# Patient Record
Sex: Male | Born: 1955 | Race: White | Hispanic: No | Marital: Married | Smoking: Never smoker
Health system: Southern US, Community
[De-identification: ages and names within clinical notes are randomized; demographics above are authoritative.]

## PROBLEM LIST (undated history)

## (undated) DIAGNOSIS — I10 Essential (primary) hypertension: Secondary | ICD-10-CM

## (undated) DIAGNOSIS — I499 Cardiac arrhythmia, unspecified: Secondary | ICD-10-CM

## (undated) DIAGNOSIS — Z87442 Personal history of urinary calculi: Secondary | ICD-10-CM

## (undated) DIAGNOSIS — R519 Headache, unspecified: Secondary | ICD-10-CM

## (undated) DIAGNOSIS — E119 Type 2 diabetes mellitus without complications: Secondary | ICD-10-CM

## (undated) DIAGNOSIS — K7460 Unspecified cirrhosis of liver: Secondary | ICD-10-CM

## (undated) DIAGNOSIS — Z973 Presence of spectacles and contact lenses: Secondary | ICD-10-CM

## (undated) DIAGNOSIS — E7800 Pure hypercholesterolemia, unspecified: Secondary | ICD-10-CM

## (undated) DIAGNOSIS — G4730 Sleep apnea, unspecified: Secondary | ICD-10-CM

## (undated) DIAGNOSIS — G510 Bell's palsy: Secondary | ICD-10-CM

## (undated) DIAGNOSIS — R51 Headache: Secondary | ICD-10-CM

## (undated) DIAGNOSIS — Z8679 Personal history of other diseases of the circulatory system: Secondary | ICD-10-CM

## (undated) DIAGNOSIS — M1990 Unspecified osteoarthritis, unspecified site: Secondary | ICD-10-CM

## (undated) DIAGNOSIS — Q631 Lobulated, fused and horseshoe kidney: Secondary | ICD-10-CM

## (undated) DIAGNOSIS — K219 Gastro-esophageal reflux disease without esophagitis: Secondary | ICD-10-CM

## (undated) DIAGNOSIS — D696 Thrombocytopenia, unspecified: Secondary | ICD-10-CM

## (undated) HISTORY — PX: ORIF PATELLA FRACTURE: SUR947

## (undated) HISTORY — PX: BICEPS TENDON REPAIR: SHX566

## (undated) HISTORY — PX: KNEE ARTHROSCOPY: SUR90

## (undated) HISTORY — PX: ADENOIDECTOMY: SUR15

## (undated) HISTORY — PX: TONSILLECTOMY: SUR1361

## (undated) HISTORY — PX: COLONOSCOPY W/ POLYPECTOMY: SHX1380

## (undated) HISTORY — PX: SPINAL CORD STIMULATOR INSERTION: SHX5378

---

## 1983-01-16 HISTORY — PX: CIRCUMCISION: SUR203

## 2013-04-29 ENCOUNTER — Other Ambulatory Visit: Payer: Self-pay | Admitting: Neurosurgery

## 2013-05-01 ENCOUNTER — Encounter (HOSPITAL_COMMUNITY): Payer: Self-pay

## 2013-05-06 NOTE — Pre-Procedure Instructions (Signed)
Brandon Pena  05/06/2013   Your procedure is scheduled on:  Fri, May 1 @ 7:30 AM  Report to [facility]  at 5:30 AM.  Call this number if you have problems the morning of surgery: [phone]   Remember:   Do not eat food or drink liquids after midnight.   Take these medicines the morning of surgery with A SIP OF WATER: Atenolol(Tenormin) and Omeprazole(Prilosec)              Stop taking your Aspirin. No Goody's,BC's,Aleve,Ibuprofen,Fish Oil,or any Herbal Medications   Do not wear jewelry  Do not wear lotions, powders, or colognes. You may wear deodorant.  Men may shave face and neck.  Do not bring valuables to the hospital.  [facility] is not responsible                  for any belongings or valuables.               Contacts, dentures or bridgework may not be worn into surgery.  Leave suitcase in the car. After surgery it may be brought to your room.  For patients admitted to the hospital, discharge time is determined by your                treatment team.               Patients discharged the day of surgery will not be allowed to drive  home.    Special Instructions:  [facility] - Preparing for Surgery  Before surgery, you can play an important role.  Because skin is not sterile, your skin needs to be as free of germs as possible.  You can reduce the number of germs on you skin by washing with CHG (chlorahexidine gluconate) soap before surgery.  CHG is an antiseptic cleaner which kills germs and bonds with the skin to continue killing germs even after washing.  Please DO NOT use if you have an allergy to CHG or antibacterial soaps.  If your skin becomes reddened/irritated stop using the CHG and inform your nurse when you arrive at Short Stay.  Do not shave (including legs and underarms) for at least 48 hours prior to the first CHG shower.  You may shave your face.  Please follow these instructions carefully:   1.  Shower with CHG Soap the night before surgery and  the                                morning of Surgery.  2.  If you choose to wash your hair, wash your hair first as usual with your       normal shampoo.  3.  After you shampoo, rinse your hair and body thoroughly to remove the                      Shampoo.  4.  Use CHG as you would any other liquid soap.  You can apply chg directly       to the skin and wash gently with scrungie or a clean washcloth.  5.  Apply the CHG Soap to your body ONLY FROM THE NECK DOWN.        Do not use on open wounds or open sores.  Avoid contact with your eyes,       ears, mouth and genitals (private parts).  Wash genitals (  private parts)       with your normal soap.  6.  Wash thoroughly, paying special attention to the area where your surgery        will be performed.  7.  Thoroughly rinse your body with warm water from the neck down.  8.  DO NOT shower/wash with your normal soap after using and rinsing off       the CHG Soap.  9.  Pat yourself dry with a clean towel.            10.  Wear clean pajamas.            11.  Place clean sheets on your bed the night of your first shower and do not        sleep with pets.  Day of Surgery  Do not apply any lotions/deoderants the morning of surgery.  Please wear clean clothes to the hospital/surgery center.     Please read over the following fact sheets that you were given: Pain Booklet, Coughing and Deep Breathing, MRSA Information and Surgical Site Infection Prevention

## 2013-05-07 ENCOUNTER — Encounter (HOSPITAL_COMMUNITY): Payer: Self-pay

## 2013-05-07 ENCOUNTER — Encounter (HOSPITAL_COMMUNITY)
Admission: RE | Admit: 2013-05-07 | Discharge: 2013-05-07 | Disposition: A | Discharge status: Home / Self Care | Payer: Worker's Compensation | Source: Ambulatory Visit | Attending: Neurosurgery | Admitting: Neurosurgery

## 2013-05-07 ENCOUNTER — Encounter (HOSPITAL_COMMUNITY)
Admission: RE | Admit: 2013-05-07 | Discharge: 2013-05-07 | Disposition: A | Discharge status: Home / Self Care | Payer: PRIVATE HEALTH INSURANCE | Source: Ambulatory Visit | Attending: Anesthesiology | Admitting: Anesthesiology

## 2013-05-07 DIAGNOSIS — Z01818 Encounter for other preprocedural examination: Secondary | ICD-10-CM | POA: Insufficient documentation

## 2013-05-07 DIAGNOSIS — Z01810 Encounter for preprocedural cardiovascular examination: Secondary | ICD-10-CM | POA: Insufficient documentation

## 2013-05-07 DIAGNOSIS — Z01812 Encounter for preprocedural laboratory examination: Secondary | ICD-10-CM | POA: Insufficient documentation

## 2013-05-07 HISTORY — DX: Gastro-esophageal reflux disease without esophagitis: K21.9

## 2013-05-07 HISTORY — DX: Type 2 diabetes mellitus without complications: E11.9

## 2013-05-07 HISTORY — DX: Essential (primary) hypertension: I10

## 2013-05-07 HISTORY — DX: Pure hypercholesterolemia, unspecified: E78.00

## 2013-05-07 HISTORY — DX: Sleep apnea, unspecified: G47.30

## 2013-05-07 HISTORY — DX: Personal history of other diseases of the circulatory system: Z86.79

## 2013-05-07 HISTORY — DX: Personal history of urinary calculi: Z87.442

## 2013-05-07 LAB — BASIC METABOLIC PANEL
BUN: 21 mg/dL (ref 6–23)
CO2: 21 mEq/L (ref 19–32)
CREATININE: 1 mg/dL (ref 0.50–1.35)
Calcium: 9.8 mg/dL (ref 8.4–10.5)
Chloride: 101 mEq/L (ref 96–112)
GFR, EST NON AFRICAN AMERICAN: 80 mL/min — AB (ref >90)
Glucose, Bld: 96 mg/dL (ref 70–99)
POTASSIUM: 4 meq/L (ref 3.7–5.3)
Sodium: 137 mEq/L (ref 137–147)

## 2013-05-07 LAB — CBC
HCT: 44.9 % (ref 39.0–52.0)
HEMOGLOBIN: 15.8 g/dL (ref 13.0–17.0)
MCH: 29.4 pg (ref 26.0–34.0)
MCHC: 35.2 g/dL (ref 30.0–36.0)
MCV: 83.6 fL (ref 78.0–100.0)
Platelets: 112 10*3/uL — ABNORMAL LOW (ref 150–400)
RBC: 5.37 MIL/uL (ref 4.22–5.81)
RDW: 14.1 % (ref 11.5–15.5)
WBC: 8.8 10*3/uL (ref 4.0–10.5)

## 2013-05-07 LAB — SURGICAL PCR SCREEN
MRSA, PCR: NEGATIVE
Staphylococcus aureus: NEGATIVE

## 2013-05-07 NOTE — Progress Notes (Signed)
Erie NoeVanessa notified that orders need to be signed

## 2013-05-07 NOTE — Progress Notes (Signed)
Patient reports 3-4 years ago he woke up with palpitations and shob. Patient reports he went to [facility] and was in a- fib, was evaluated by cardiology and spontaneously converted back to NSR. Patient reports he has not had any symptoms since then. Records requested from [facility] in [place] (any cardiac test, OV, EKG)

## 2013-05-11 NOTE — Progress Notes (Signed)
Brandon Pena is a 58 year old male scheduled for  one level lumbar micro-discectomy by Dr. Wynetta Emerycram on 05/15/2013.  He has a history of obesity, sleep apnea hypertension, type 2 diabetes, and atrial fibrillation. He presented with atrial fibrillation in March 2011. He underwent a stress dobutamine echocardiography which showed no evidence of ischemia normal left ventricular function. He was in cardioverted to sinus rhythm. He is maintaining sinus rhythm since then. His most recent EKG on 05/07/2013 showed normal sinus rhythm. His only anticoagulant is aspirin.  Labs were unremarkable.  He will be evaluated by the anesthesiologist assigned to this case and 05/15/2013.  [name], MD

## 2013-05-14 MED ORDER — DEXAMETHASONE SODIUM PHOSPHATE 10 MG/ML IJ SOLN
10.0000 mg | INTRAMUSCULAR | Status: AC
  Administered 2013-05-15: 10 mg via INTRAVENOUS
  Filled 2013-05-14: qty 1

## 2013-05-14 MED ORDER — DEXTROSE 5 % IV SOLN
3.0000 g | INTRAVENOUS | Status: AC
  Administered 2013-05-15: 3 g via INTRAVENOUS
  Filled 2013-05-14: qty 3000

## 2013-05-15 ENCOUNTER — Encounter (HOSPITAL_COMMUNITY): Payer: Self-pay

## 2013-05-15 ENCOUNTER — Ambulatory Visit (HOSPITAL_COMMUNITY): Payer: Worker's Compensation

## 2013-05-15 ENCOUNTER — Ambulatory Visit (HOSPITAL_COMMUNITY)
Admission: RE | Admit: 2013-05-15 | Discharge: 2013-05-16 | Disposition: A | Discharge status: Home / Self Care | Payer: Worker's Compensation | Source: Ambulatory Visit | Attending: Neurosurgery | Admitting: Neurosurgery

## 2013-05-15 ENCOUNTER — Encounter (HOSPITAL_COMMUNITY)
Admission: RE | Disposition: A | Discharge status: Home / Self Care | Payer: Self-pay | Source: Ambulatory Visit | Attending: Neurosurgery

## 2013-05-15 ENCOUNTER — Encounter (HOSPITAL_COMMUNITY): Payer: Worker's Compensation | Admitting: Anesthesiology

## 2013-05-15 ENCOUNTER — Ambulatory Visit (HOSPITAL_COMMUNITY): Payer: Worker's Compensation | Admitting: Anesthesiology

## 2013-05-15 DIAGNOSIS — M47817 Spondylosis without myelopathy or radiculopathy, lumbosacral region: Secondary | ICD-10-CM | POA: Insufficient documentation

## 2013-05-15 DIAGNOSIS — M5126 Other intervertebral disc displacement, lumbar region: Secondary | ICD-10-CM | POA: Diagnosis present

## 2013-05-15 DIAGNOSIS — Z7982 Long term (current) use of aspirin: Secondary | ICD-10-CM | POA: Insufficient documentation

## 2013-05-15 DIAGNOSIS — E119 Type 2 diabetes mellitus without complications: Secondary | ICD-10-CM | POA: Insufficient documentation

## 2013-05-15 DIAGNOSIS — E7800 Pure hypercholesterolemia, unspecified: Secondary | ICD-10-CM | POA: Insufficient documentation

## 2013-05-15 DIAGNOSIS — K219 Gastro-esophageal reflux disease without esophagitis: Secondary | ICD-10-CM | POA: Insufficient documentation

## 2013-05-15 DIAGNOSIS — I10 Essential (primary) hypertension: Secondary | ICD-10-CM | POA: Insufficient documentation

## 2013-05-15 DIAGNOSIS — I4891 Unspecified atrial fibrillation: Secondary | ICD-10-CM | POA: Insufficient documentation

## 2013-05-15 DIAGNOSIS — G4730 Sleep apnea, unspecified: Secondary | ICD-10-CM | POA: Insufficient documentation

## 2013-05-15 HISTORY — PX: LUMBAR LAMINECTOMY/DECOMPRESSION MICRODISCECTOMY: SHX5026

## 2013-05-15 LAB — GLUCOSE, CAPILLARY
GLUCOSE-CAPILLARY: 89 mg/dL (ref 70–99)
GLUCOSE-CAPILLARY: 131 mg/dL — AB (ref 70–99)
Glucose-Capillary: 137 mg/dL — ABNORMAL HIGH (ref 70–99)
Glucose-Capillary: 140 mg/dL — ABNORMAL HIGH (ref 70–99)

## 2013-05-15 SURGERY — LUMBAR LAMINECTOMY/DECOMPRESSION MICRODISCECTOMY 1 LEVEL
Anesthesia: General | Site: Back | Laterality: Left

## 2013-05-15 MED ORDER — ACETAMINOPHEN 650 MG RE SUPP: 650.0000 mg | RECTAL | Status: DC | PRN

## 2013-05-15 MED ORDER — FENTANYL CITRATE 0.05 MG/ML IJ SOLN
INTRAMUSCULAR | Status: AC
  Filled 2013-05-15: qty 5

## 2013-05-15 MED ORDER — ROCURONIUM BROMIDE 100 MG/10ML IV SOLN
INTRAVENOUS | Status: DC | PRN
  Administered 2013-05-15: 50 mg via INTRAVENOUS

## 2013-05-15 MED ORDER — PROPOFOL 10 MG/ML IV BOLUS
INTRAVENOUS | Status: AC
  Filled 2013-05-15: qty 20

## 2013-05-15 MED ORDER — EPHEDRINE SULFATE 50 MG/ML IJ SOLN
INTRAMUSCULAR | Status: DC | PRN
  Administered 2013-05-15: 15 mg via INTRAVENOUS

## 2013-05-15 MED ORDER — LOSARTAN POTASSIUM 50 MG PO TABS
50.0000 mg | ORAL_TABLET | Freq: Every day | ORAL | Status: DC
  Administered 2013-05-16: 50 mg via ORAL
  Filled 2013-05-15: qty 1

## 2013-05-15 MED ORDER — HEMOSTATIC AGENTS (NO CHARGE) OPTIME
TOPICAL | Status: DC | PRN
  Administered 2013-05-15: 1 via TOPICAL

## 2013-05-15 MED ORDER — HYDROMORPHONE HCL PF 1 MG/ML IJ SOLN
0.5000 mg | INTRAMUSCULAR | Status: DC | PRN
  Administered 2013-05-15: 1 mg via INTRAVENOUS
  Filled 2013-05-15: qty 1

## 2013-05-15 MED ORDER — VECURONIUM BROMIDE 10 MG IV SOLR
INTRAVENOUS | Status: DC | PRN
  Administered 2013-05-15: 3 mg via INTRAVENOUS
  Administered 2013-05-15: 1 mg via INTRAVENOUS

## 2013-05-15 MED ORDER — ALUM & MAG HYDROXIDE-SIMETH 200-200-20 MG/5ML PO SUSP: 30.0000 mL | Freq: Four times a day (QID) | ORAL | Status: DC | PRN

## 2013-05-15 MED ORDER — MENTHOL 3 MG MT LOZG: 1.0000 | LOZENGE | OROMUCOSAL | Status: DC | PRN

## 2013-05-15 MED ORDER — MIDAZOLAM HCL 2 MG/2ML IJ SOLN
INTRAMUSCULAR | Status: AC
  Filled 2013-05-15: qty 2

## 2013-05-15 MED ORDER — HYDROMORPHONE HCL PF 1 MG/ML IJ SOLN: 0.2500 mg | INTRAMUSCULAR | Status: DC | PRN

## 2013-05-15 MED ORDER — GLYCOPYRROLATE 0.2 MG/ML IJ SOLN
INTRAMUSCULAR | Status: DC | PRN
  Administered 2013-05-15: .8 mg via INTRAVENOUS

## 2013-05-15 MED ORDER — SODIUM CHLORIDE 0.9 % IV SOLN
INTRAVENOUS | Status: DC | PRN
  Administered 2013-05-15 (×2): via INTRAVENOUS

## 2013-05-15 MED ORDER — EPHEDRINE SULFATE 50 MG/ML IJ SOLN
INTRAMUSCULAR | Status: AC
  Filled 2013-05-15: qty 1

## 2013-05-15 MED ORDER — ONDANSETRON HCL 4 MG/2ML IJ SOLN
INTRAMUSCULAR | Status: AC
  Filled 2013-05-15: qty 2

## 2013-05-15 MED ORDER — THROMBIN 5000 UNITS EX SOLR
CUTANEOUS | Status: DC | PRN
  Administered 2013-05-15 (×2): 5000 [IU] via TOPICAL

## 2013-05-15 MED ORDER — ONDANSETRON HCL 4 MG/2ML IJ SOLN
INTRAMUSCULAR | Status: DC | PRN
  Administered 2013-05-15: 4 mg via INTRAVENOUS

## 2013-05-15 MED ORDER — PROPOFOL 10 MG/ML IV BOLUS
INTRAVENOUS | Status: DC | PRN
  Administered 2013-05-15: 300 mg via INTRAVENOUS

## 2013-05-15 MED ORDER — METOCLOPRAMIDE HCL 5 MG/ML IJ SOLN: 10.0000 mg | Freq: Once | INTRAMUSCULAR | Status: DC | PRN

## 2013-05-15 MED ORDER — LIDOCAINE HCL (CARDIAC) 20 MG/ML IV SOLN
INTRAVENOUS | Status: AC
  Filled 2013-05-15: qty 5

## 2013-05-15 MED ORDER — LIDOCAINE-EPINEPHRINE 1 %-1:100000 IJ SOLN
INTRAMUSCULAR | Status: DC | PRN
  Administered 2013-05-15: 10 mL

## 2013-05-15 MED ORDER — ATENOLOL 50 MG PO TABS
50.0000 mg | ORAL_TABLET | Freq: Every day | ORAL | Status: DC
  Administered 2013-05-16: 50 mg via ORAL
  Filled 2013-05-15: qty 1

## 2013-05-15 MED ORDER — ACETAMINOPHEN 325 MG PO TABS: 650.0000 mg | ORAL_TABLET | ORAL | Status: DC | PRN

## 2013-05-15 MED ORDER — CEFAZOLIN SODIUM 1-5 GM-% IV SOLN
1.0000 g | Freq: Three times a day (TID) | INTRAVENOUS | Status: AC
  Administered 2013-05-15 (×2): 1 g via INTRAVENOUS
  Filled 2013-05-15 (×3): qty 50

## 2013-05-15 MED ORDER — SIMVASTATIN 5 MG PO TABS
5.0000 mg | ORAL_TABLET | Freq: Every day | ORAL | Status: DC
  Administered 2013-05-15: 5 mg via ORAL
  Filled 2013-05-15 (×2): qty 1

## 2013-05-15 MED ORDER — LACTATED RINGERS IV SOLN
INTRAVENOUS | Status: DC | PRN
  Administered 2013-05-15: 07:00:00 via INTRAVENOUS

## 2013-05-15 MED ORDER — BUPIVACAINE HCL (PF) 0.25 % IJ SOLN
INTRAMUSCULAR | Status: DC | PRN
  Administered 2013-05-15: 10 mL

## 2013-05-15 MED ORDER — ROCURONIUM BROMIDE 50 MG/5ML IV SOLN
INTRAVENOUS | Status: AC
  Filled 2013-05-15: qty 1

## 2013-05-15 MED ORDER — DOCUSATE SODIUM 100 MG PO CAPS
100.0000 mg | ORAL_CAPSULE | Freq: Two times a day (BID) | ORAL | Status: DC
  Administered 2013-05-15 – 2013-05-16 (×2): 100 mg via ORAL
  Filled 2013-05-15 (×3): qty 1

## 2013-05-15 MED ORDER — VECURONIUM BROMIDE 10 MG IV SOLR
INTRAVENOUS | Status: AC
  Filled 2013-05-15: qty 10

## 2013-05-15 MED ORDER — SODIUM CHLORIDE 0.9 % IR SOLN
Status: DC | PRN
  Administered 2013-05-15: 08:00:00

## 2013-05-15 MED ORDER — LIDOCAINE HCL (CARDIAC) 20 MG/ML IV SOLN
INTRAVENOUS | Status: DC | PRN
  Administered 2013-05-15: 100 mg via INTRAVENOUS

## 2013-05-15 MED ORDER — FENTANYL CITRATE 0.05 MG/ML IJ SOLN
INTRAMUSCULAR | Status: DC | PRN
  Administered 2013-05-15: 100 ug via INTRAVENOUS
  Administered 2013-05-15: 150 ug via INTRAVENOUS
  Administered 2013-05-15 (×3): 50 ug via INTRAVENOUS
  Administered 2013-05-15: 100 ug via INTRAVENOUS

## 2013-05-15 MED ORDER — ASPIRIN 325 MG PO TABS
162.5000 mg | ORAL_TABLET | Freq: Every day | ORAL | Status: DC
  Filled 2013-05-15: qty 0.5

## 2013-05-15 MED ORDER — ONDANSETRON HCL 4 MG/2ML IJ SOLN: 4.0000 mg | INTRAMUSCULAR | Status: DC | PRN

## 2013-05-15 MED ORDER — PHENOL 1.4 % MT LIQD: 1.0000 | OROMUCOSAL | Status: DC | PRN

## 2013-05-15 MED ORDER — NEOSTIGMINE METHYLSULFATE 10 MG/10ML IV SOLN
INTRAVENOUS | Status: DC | PRN
  Administered 2013-05-15: 4 mg via INTRAVENOUS

## 2013-05-15 MED ORDER — OXYCODONE HCL 5 MG/5ML PO SOLN: 5.0000 mg | Freq: Once | ORAL | Status: DC | PRN

## 2013-05-15 MED ORDER — SODIUM CHLORIDE 0.9 % IJ SOLN: 3.0000 mL | INTRAMUSCULAR | Status: DC | PRN

## 2013-05-15 MED ORDER — METOCLOPRAMIDE HCL 5 MG/ML IJ SOLN
INTRAMUSCULAR | Status: AC
  Filled 2013-05-15: qty 2

## 2013-05-15 MED ORDER — MELOXICAM 15 MG PO TABS
15.0000 mg | ORAL_TABLET | Freq: Every day | ORAL | Status: DC
  Administered 2013-05-16: 15 mg via ORAL
  Filled 2013-05-15 (×2): qty 1

## 2013-05-15 MED ORDER — TRIAMTERENE-HCTZ 37.5-25 MG PO TABS
1.0000 | ORAL_TABLET | Freq: Every day | ORAL | Status: DC
Start: 2013-05-16 — End: 2013-05-16
  Administered 2013-05-16: 1 via ORAL
  Filled 2013-05-15: qty 1

## 2013-05-15 MED ORDER — OXYCODONE-ACETAMINOPHEN 5-325 MG PO TABS
1.0000 | ORAL_TABLET | ORAL | Status: DC | PRN
  Administered 2013-05-15 – 2013-05-16 (×2): 2 via ORAL
  Filled 2013-05-15 (×2): qty 2

## 2013-05-15 MED ORDER — MIDAZOLAM HCL 5 MG/5ML IJ SOLN
INTRAMUSCULAR | Status: DC | PRN
Start: 2013-05-15 — End: 2013-05-15
  Administered 2013-05-15: 2 mg via INTRAVENOUS

## 2013-05-15 MED ORDER — METOCLOPRAMIDE HCL 5 MG/ML IJ SOLN
INTRAMUSCULAR | Status: DC | PRN
  Administered 2013-05-15: 10 mg via INTRAVENOUS

## 2013-05-15 MED ORDER — METFORMIN HCL 500 MG PO TABS
500.0000 mg | ORAL_TABLET | Freq: Two times a day (BID) | ORAL | Status: DC
  Administered 2013-05-15 – 2013-05-16 (×2): 500 mg via ORAL
  Filled 2013-05-15 (×4): qty 1

## 2013-05-15 MED ORDER — CYCLOBENZAPRINE HCL 10 MG PO TABS
10.0000 mg | ORAL_TABLET | Freq: Three times a day (TID) | ORAL | Status: DC | PRN
  Administered 2013-05-15: 10 mg via ORAL
  Filled 2013-05-15 (×2): qty 1

## 2013-05-15 MED ORDER — SODIUM CHLORIDE 0.9 % IJ SOLN
3.0000 mL | Freq: Two times a day (BID) | INTRAMUSCULAR | Status: DC
  Administered 2013-05-15 (×2): 3 mL via INTRAVENOUS

## 2013-05-15 MED ORDER — PANTOPRAZOLE SODIUM 40 MG PO TBEC
40.0000 mg | DELAYED_RELEASE_TABLET | Freq: Every day | ORAL | Status: DC
  Administered 2013-05-16: 40 mg via ORAL
  Filled 2013-05-15: qty 1

## 2013-05-15 MED ORDER — 0.9 % SODIUM CHLORIDE (POUR BTL) OPTIME
TOPICAL | Status: DC | PRN
  Administered 2013-05-15: 1000 mL

## 2013-05-15 MED ORDER — OXYCODONE HCL 5 MG PO TABS: 5.0000 mg | ORAL_TABLET | Freq: Once | ORAL | Status: DC | PRN

## 2013-05-15 SURGICAL SUPPLY — 62 items
BAG DECANTER FOR FLEXI CONT (MISCELLANEOUS) ×3 IMPLANT
BENZOIN TINCTURE PRP APPL 2/3 (GAUZE/BANDAGES/DRESSINGS) ×3 IMPLANT
BLADE 10 SAFETY STRL DISP (BLADE) IMPLANT
BLADE SURG 11 STRL SS (BLADE) ×3 IMPLANT
BLADE SURG ROTATE 9660 (MISCELLANEOUS) IMPLANT
BRUSH SCRUB EZ PLAIN DRY (MISCELLANEOUS) ×3 IMPLANT
BUR MATCHSTICK NEURO 3.0 LAGG (BURR) ×3 IMPLANT
BUR PRECISION FLUTE 6.0 (BURR) ×3 IMPLANT
CANISTER SUCT 3000ML (MISCELLANEOUS) ×3 IMPLANT
CLOSURE WOUND 1/2 X4 (GAUZE/BANDAGES/DRESSINGS) ×1
CONT SPEC 4OZ CLIKSEAL STRL BL (MISCELLANEOUS) ×3 IMPLANT
DECANTER SPIKE VIAL GLASS SM (MISCELLANEOUS) ×3 IMPLANT
DERMABOND ADHESIVE PROPEN (GAUZE/BANDAGES/DRESSINGS) ×2
DERMABOND ADVANCED (GAUZE/BANDAGES/DRESSINGS) ×2
DERMABOND ADVANCED .7 DNX12 (GAUZE/BANDAGES/DRESSINGS) ×1 IMPLANT
DERMABOND ADVANCED .7 DNX6 (GAUZE/BANDAGES/DRESSINGS) ×1 IMPLANT
DRAPE LAPAROTOMY 100X72X124 (DRAPES) ×3 IMPLANT
DRAPE MICROSCOPE ZEISS OPMI (DRAPES) ×3 IMPLANT
DRAPE POUCH INSTRU U-SHP 10X18 (DRAPES) ×3 IMPLANT
DRAPE PROXIMA HALF (DRAPES) IMPLANT
DRAPE SURG 17X23 STRL (DRAPES) ×3 IMPLANT
DRSG OPSITE 4X5.5 SM (GAUZE/BANDAGES/DRESSINGS) ×3 IMPLANT
DRSG OPSITE POSTOP 3X4 (GAUZE/BANDAGES/DRESSINGS) ×3 IMPLANT
DURAPREP 26ML APPLICATOR (WOUND CARE) ×3 IMPLANT
ELECT BLADE 4.0 EZ CLEAN MEGAD (MISCELLANEOUS) ×3
ELECT REM PT RETURN 9FT ADLT (ELECTROSURGICAL) ×3
ELECTRODE BLDE 4.0 EZ CLN MEGD (MISCELLANEOUS) ×1 IMPLANT
ELECTRODE REM PT RTRN 9FT ADLT (ELECTROSURGICAL) ×1 IMPLANT
GAUZE SPONGE 4X4 16PLY XRAY LF (GAUZE/BANDAGES/DRESSINGS) ×3 IMPLANT
GLOVE BIO SURGEON STRL SZ8 (GLOVE) ×3 IMPLANT
GLOVE ECLIPSE 8.0 STRL XLNG CF (GLOVE) ×3 IMPLANT
GLOVE EXAM NITRILE LRG STRL (GLOVE) IMPLANT
GLOVE EXAM NITRILE MD LF STRL (GLOVE) IMPLANT
GLOVE EXAM NITRILE XL STR (GLOVE) IMPLANT
GLOVE EXAM NITRILE XS STR PU (GLOVE) IMPLANT
GLOVE INDICATOR 8.5 STRL (GLOVE) ×3 IMPLANT
GOWN BRE IMP SLV AUR LG STRL (GOWN DISPOSABLE) IMPLANT
GOWN BRE IMP SLV AUR XL STRL (GOWN DISPOSABLE) IMPLANT
GOWN STRL REIN 2XL LVL4 (GOWN DISPOSABLE) IMPLANT
GOWN STRL REUS W/ TWL LRG LVL3 (GOWN DISPOSABLE) ×2 IMPLANT
GOWN STRL REUS W/ TWL XL LVL3 (GOWN DISPOSABLE) ×2 IMPLANT
GOWN STRL REUS W/TWL LRG LVL3 (GOWN DISPOSABLE) ×4
GOWN STRL REUS W/TWL XL LVL3 (GOWN DISPOSABLE) ×4
KIT BASIN OR (CUSTOM PROCEDURE TRAY) ×3 IMPLANT
KIT ROOM TURNOVER OR (KITS) ×3 IMPLANT
NEEDLE HYPO 22GX1.5 SAFETY (NEEDLE) ×3 IMPLANT
NEEDLE SPNL 22GX3.5 QUINCKE BK (NEEDLE) ×3 IMPLANT
NS IRRIG 1000ML POUR BTL (IV SOLUTION) ×3 IMPLANT
PACK LAMINECTOMY NEURO (CUSTOM PROCEDURE TRAY) ×3 IMPLANT
RUBBERBAND STERILE (MISCELLANEOUS) ×6 IMPLANT
SPONGE GAUZE 4X4 12PLY (GAUZE/BANDAGES/DRESSINGS) ×3 IMPLANT
SPONGE SURGIFOAM ABS GEL SZ50 (HEMOSTASIS) ×3 IMPLANT
STRIP CLOSURE SKIN 1/2X4 (GAUZE/BANDAGES/DRESSINGS) ×2 IMPLANT
SUT VIC AB 0 CT1 18XCR BRD8 (SUTURE) ×1 IMPLANT
SUT VIC AB 0 CT1 8-18 (SUTURE) ×2
SUT VIC AB 2-0 CT1 18 (SUTURE) ×3 IMPLANT
SUT VICRYL 4-0 PS2 18IN ABS (SUTURE) ×3 IMPLANT
SYR 20ML ECCENTRIC (SYRINGE) ×3 IMPLANT
TAPE STRIPS DRAPE STRL (GAUZE/BANDAGES/DRESSINGS) ×3 IMPLANT
TOWEL OR 17X24 6PK STRL BLUE (TOWEL DISPOSABLE) ×3 IMPLANT
TOWEL OR 17X26 10 PK STRL BLUE (TOWEL DISPOSABLE) ×3 IMPLANT
WATER STERILE IRR 1000ML POUR (IV SOLUTION) ×3 IMPLANT

## 2013-05-15 NOTE — Anesthesia Preprocedure Evaluation (Addendum)
Anesthesia Evaluation  Patient identified by MRN, date of birth, ID band Patient awake    Reviewed: Allergy & Precautions, H&P , NPO status , Patient's Chart, lab work & pertinent test results, reviewed documented beta blocker date and time   Airway Mallampati: II TM Distance: >3 FB Neck ROM: full    Dental  (+) Dental Advisory Given   Pulmonary neg pulmonary ROS, sleep apnea ,  breath sounds clear to auscultation        Cardiovascular hypertension, On Medications, On Home Beta Blockers and Pt. on home beta blockers Rhythm:regular     Neuro/Psych negative neurological ROS  negative psych ROS   GI/Hepatic Neg liver ROS, GERD-  Medicated and Controlled,  Endo/Other  diabetes, Oral Hypoglycemic AgentsMorbid obesity  Renal/GU negative Renal ROS  negative genitourinary   Musculoskeletal   Abdominal   Peds  Hematology negative hematology ROS (+)   Anesthesia Other Findings See surgeon's H&P   Reproductive/Obstetrics negative OB ROS                          Anesthesia Physical Anesthesia Plan  ASA: III  Anesthesia Plan: General   Post-op Pain Management:    Induction: Intravenous  Airway Management Planned: Oral ETT  Additional Equipment:   Intra-op Plan:   Post-operative Plan: Extubation in OR  Informed Consent: I have reviewed the patients History and Physical, chart, labs and discussed the procedure including the risks, benefits and alternatives for the proposed anesthesia with the patient or authorized representative who has indicated his/her understanding and acceptance.   Dental Advisory Given  Plan Discussed with: CRNA and Surgeon  Anesthesia Plan Comments:         Anesthesia Quick Evaluation

## 2013-05-15 NOTE — Anesthesia Procedure Notes (Signed)
Procedure Name: Intubation Date/Time: 05/15/2013 7:32 AM Performed by: Alanda AmassFRIEDMAN, Daimion Adamcik A Pre-anesthesia Checklist: Patient identified, Timeout performed, Emergency Drugs available, Suction available and Patient being monitored Patient Re-evaluated:Patient Re-evaluated prior to inductionOxygen Delivery Method: Circle system utilized Preoxygenation: Pre-oxygenation with 100% oxygen Intubation Type: IV induction Ventilation: Mask ventilation without difficulty and Oral airway inserted - appropriate to patient size Laryngoscope Size: Mac and 3 Grade View: Grade III Tube type: Oral Tube size: 7.5 mm Number of attempts: 1 Airway Equipment and Method: Stylet Placement Confirmation: ETT inserted through vocal cords under direct vision,  breath sounds checked- equal and bilateral and positive ETCO2 Secured at: 24 cm Tube secured with: Tape Dental Injury: Teeth and Oropharynx as per pre-operative assessment

## 2013-05-15 NOTE — H&P (Signed)
Brandon Pena is an 58 y.o. male.   Chief Complaint: Back and left leg pain HPI: Patient is a very pleasant 58 year old 7 who sustained a work-related injury resulting in disc herniation and severe intractable left L5 radiculopathy. Workup revealed severe stenosis and compression of left L5 nerve root combination of spondylosis and disc herniation patient failed all forms of conservative treatment with anti-inflammatories physical therapy epidural steroid injections. And due to his failure conservative treatment, progression of clinical syndrome, and imaging findings I recommended an L4-5 laminectomy inspection this possible discectomy at L4-5 on the left. I extensively went over the risks and benefits of the operation the patient as well as perioperative course and expectations of outcome and alternatives of surgery he understands and reached proceed forward.  Past Medical History  Diagnosis Date  . Hypercholesteremia   . Type II diabetes mellitus   . Hypertension     Does not see a cardiologist  . History of atrial fibrillation     reports one time issue in 2012  . History of kidney stones   . GERD (gastroesophageal reflux disease)   . Sleep apnea     uses CPAP, last sleep study done 8-4346yrs. ago    Past Surgical History  Procedure Laterality Date  . Knee arthroscopy Left 1980's  . Orif patella fracture Left 1980's  . Circumcision  1985  . Biceps tendon repair  1990's    No family history on file. Social History:  reports that he has never smoked. He has never used smokeless tobacco. He reports that he does not drink alcohol or use illicit drugs.  Allergies: Not on File  Medications Prior to Admission  Medication Sig Dispense Refill  . aspirin 325 MG tablet Take 162.5 mg by mouth daily.      Marland Kitchen. atenolol (TENORMIN) 50 MG tablet Take 50 mg by mouth daily.      . Cholecalciferol (VITAMIN D PO) Take 1 tablet by mouth daily.      Marland Kitchen. losartan (COZAAR) 50 MG tablet Take 50 mg by mouth  daily.      . meloxicam (MOBIC) 15 MG tablet Take 15 mg by mouth daily.      . metFORMIN (GLUCOPHAGE) 500 MG tablet Take 500 mg by mouth 2 (two) times daily with a meal.      . omeprazole (PRILOSEC) 20 MG capsule Take 20 mg by mouth 2 (two) times daily before a meal.      . Potassium (POTASSIMIN PO) Take 1 tablet by mouth daily. OTC store brand      . pravastatin (PRAVACHOL) 40 MG tablet Take 40 mg by mouth daily.      Marland Kitchen. triamterene-hydrochlorothiazide (MAXZIDE-25) 37.5-25 MG per tablet Take 1 tablet by mouth daily.        Results for orders placed during the hospital encounter of 05/15/13 (from the past 48 hour(s))  GLUCOSE, CAPILLARY     Status: None   Collection Time    05/15/13  6:18 AM      Result Value Ref Range   Glucose-Capillary 89  70 - 99 mg/dL   No results found.  Review of Systems  Constitutional: Negative.   HENT: Negative.   Eyes: Negative.   Respiratory: Negative.   Cardiovascular: Negative.   Gastrointestinal: Negative.   Genitourinary: Negative.   Musculoskeletal: Positive for back pain and myalgias.  Skin: Negative.   Neurological: Positive for tingling and sensory change.  Endo/Heme/Allergies: Negative.   Psychiatric/Behavioral: Negative.     Blood  pressure 110/65, pulse 72, temperature 98 F (36.7 C), temperature source Oral, resp. rate 18, SpO2 98.00%. Physical Exam  Constitutional: He is oriented to person, place, and time. He appears well-developed and well-nourished.  HENT:  Head: Normocephalic.  Eyes: Pupils are equal, round, and reactive to light.  Neck: Normal range of motion.  Cardiovascular: Normal rate.   Respiratory: Effort normal.  GI: Soft.  Neurological: He is alert and oriented to person, place, and time. He has normal strength. GCS eye subscore is 4. GCS verbal subscore is 5. GCS motor subscore is 6.  Strength is 5 out of 5 in his iliopsoas, quads, hamstrings, gastrocs, into tibialis, and EHL.  Skin: Skin is warm and dry.      Assessment/Plan 58 years and presents for an L4-5 decompression laminectomy discectomy in the left.  Brandon Pena 05/15/2013, 7:08 AM

## 2013-05-15 NOTE — Anesthesia Postprocedure Evaluation (Signed)
Anesthesia Post Note  Patient: Brandon Pena  Procedure(s) Performed: Procedure(s) (LRB): LUMBAR LAMINECTOMY/DECOMPRESSION MICRODISCECTOMY 1 LEVEL (Left)  Anesthesia type: General  Patient location: PACU  Post pain: Pain level controlled  Post assessment: Patient's Cardiovascular Status Stable  Last Vitals:  Filed Vitals:   05/15/13 1009  BP: 115/52  Pulse: 77  Temp:   Resp: 13    Post vital signs: Reviewed and stable  Level of consciousness: alert  Complications: No apparent anesthesia complications

## 2013-05-15 NOTE — Op Note (Signed)
Preoperative diagnosis: Left L5 radiculopathy from lumbar spondylosis and herniated nucleus pulposis L4-5 left  For several diagnosis: Same  Procedure: Lumbar laminectomy discectomy L4-5 left microdissection of left L5 nerve root microscopic discectomy  Surgeon: Jillyn HiddenGary Tenasia Aull  Shift: Aliene Beamsandy Kritzer  Anesthesia: Gen.  EBL: Minimal  History of present illness: Patient reports tonight or gentleman sustained a work-related injury resulting in severe back and left leg pain diagnosis a herniated) and L5 radiculopathy. Patient failed all forms conservative treatment anti-inflammatories physical therapy epidural steroid injections. Workup revealed herniated disc and severe lateral recess stenosis L4-5 on the left due to stricture of the treatment progression of clinical syndrome and imaging findings have recommended lumbar laminectomy microdiscectomy L4-5 on the left I extensively reviewed the risks and benefits of the operation the patient as well as perioperative course and expectations of outcome alternatives of surgery he understood and agreed to proceed forward.  Operative procedure: Patient brought into the or was induced under general anesthesia positioned prone the Wilson frame his back was prepped and draped in routine sterile fashion preoperative x-ray localize the appropriate level so after infiltration of 10 cc lidocaine with epi a midline incision was made and Bovie light cautery was used to take down the subcutaneous tissues and subperiosteal dissections care lamina of L4 and L5 on the left side. Interoperative x-ray confirmed the appropriate level so the interest of L4 medial facet complex aggressive L5 is drilled a high-speed drill laminotomy was begun with a 2 and 3 Kerrison punch ligament was identified in piecemeal fashion there was marked spondylosis and severe compression of the lateral aspect of thecal sac from hypertrophy of the facet joints at all under been identify the L5 pedicle and the  L5 nerve root was identified and unroofed at the level of pedicle. Working superiorly to the space was identified and inspected and remains quite a disc was herniated still partially calcified so this was bitten away with him a Kerrison punch and pituitary rongeurs were used to remove the annulus and disc space. This is clean and pituitary rongeurs and Epstein curettes there was an extensive amount of herniation underneath the facet complexes is also teased away decompress the extra foraminal space as well. At the discectomy there is no further stenosis and the L5 nerve root the L4 foramen was also probed was in to see her good fixing space was maintained on oral Hemovac drain was placed and was closed in layers with after Vicryl the skin was) 4 subcuticular benzoin Steri-Strips applied patient recovered in stable condition. R. counts sponge counts were correct.

## 2013-05-15 NOTE — Plan of Care (Signed)
Problem: Consults Goal: Diagnosis - Spinal Surgery Outcome: Completed/Met Date Met:  05/15/13 Lumbar Laminectomy (Complex)

## 2013-05-15 NOTE — Transfer of Care (Signed)
Immediate Anesthesia Transfer of Care Note  Patient: Brandon Pena  Procedure(s) Performed: Procedure(s) with comments: LUMBAR LAMINECTOMY/DECOMPRESSION MICRODISCECTOMY 1 LEVEL (Left) - LUMBAR LAMINECTOMY/DECOMPRESSION MICRODISCECTOMY 1 LEVEL L4-5  Patient Location: PACU  Anesthesia Type:General  Level of Consciousness: sedated  Airway & Oxygen Therapy: Patient Spontanous Breathing and Patient connected to nasal cannula oxygen  Post-op Assessment: Report given to PACU RN and Post -op Vital signs reviewed and stable  Post vital signs: Reviewed and stable  Complications: No apparent anesthesia complications

## 2013-05-16 LAB — GLUCOSE, CAPILLARY: GLUCOSE-CAPILLARY: 103 mg/dL — AB (ref 70–99)

## 2013-05-16 MED ORDER — ASPIRIN 81 MG PO CHEW
162.0000 mg | CHEWABLE_TABLET | Freq: Every day | ORAL | Status: DC
  Administered 2013-05-16: 162 mg via ORAL
  Filled 2013-05-16: qty 2

## 2013-05-16 MED ORDER — HYDROCODONE-IBUPROFEN 7.5-200 MG PO TABS: 1.0000 | ORAL_TABLET | ORAL | Status: DC | PRN

## 2013-05-16 NOTE — Progress Notes (Signed)
Pt. Alert and oriented,follows simple instructions, denies pain. Incision area without swelling, redness or S/S of infection. Voiding adequate clear yellow urine. Moving all extremities well and vitals stable and documented. Patient discharged home with family.  Lumbar surgery notes instructions given to patient and family member for home safety and precautions. Pt. and family stated understanding of instructions given 

## 2013-05-16 NOTE — Discharge Summary (Signed)
Physician Discharge Summary  Patient ID: Brandon Pena MRN: [id number] DOB/AGE: 59-Jan-1957 59 y.o.  Admit date: 05/15/2013 Discharge date: 05/16/2013  Admission Diagnoses: Lumbar disc herniation   Discharge Diagnoses: Same   Discharged Condition: good  Hospital Course: The patient was admitted on 05/15/2013 and taken to the operating room where the patient underwent microdiscectomy. The patient tolerated the procedure well and was taken to the recovery room and then to the floor in stable condition. The hospital course was routine. There were no complications. The wound remained clean dry and intact. Pt had appropriate back soreness. No complaints of leg pain or new N/T/W. The patient remained afebrile with stable vital signs, and tolerated a regular diet. The patient continued to increase activities, and pain was well controlled with oral pain medications.   Consults: None  Significant Diagnostic Studies:  Results for orders placed during the hospital encounter of 05/15/13  GLUCOSE, CAPILLARY      Result Value Ref Range   Glucose-Capillary 89  70 - 99 mg/dL  GLUCOSE, CAPILLARY      Result Value Ref Range   Glucose-Capillary 137 (*) 70 - 99 mg/dL   Comment 1 Notify RN     Comment 2 Documented in Chart    GLUCOSE, CAPILLARY      Result Value Ref Range   Glucose-Capillary 140 (*) 70 - 99 mg/dL   Comment 1 Notify RN     Comment 2 Documented in Chart    GLUCOSE, CAPILLARY      Result Value Ref Range   Glucose-Capillary 131 (*) 70 - 99 mg/dL    Dg Chest 2 View  4/09/81194/23/2015   CLINICAL DATA:  Hypertension, pre admission film  EXAM: CHEST  2 VIEW  COMPARISON:  None.  FINDINGS: The heart size and mediastinal contours are within normal limits. Both lungs are clear. The visualized skeletal structures are unremarkable.  IMPRESSION: No active cardiopulmonary disease.   Electronically Signed   By: Alcide CleverMark  Lukens M.D.   On: 05/07/2013 09:53   Dg Lumbar Spine 2-3 Views  05/15/2013   CLINICAL  DATA:  L4-5 laminectomy.  EXAM: LUMBAR SPINE - 2-3 VIEW  COMPARISON:  MR lumbar spine 03/12/2013.  FINDINGS: We are provided with 2 intraoperative views of the lumbar spine in the lateral projection. On the first image, a metallic probe is directed toward the L5-S1 interspace. On the second image, the L4-5 level is localized.  IMPRESSION: L4-5 localization.   Electronically Signed   By: Drusilla Kannerhomas  Dalessio M.D.   On: 05/15/2013 09:42    Antibiotics:  Anti-infectives   Start     Dose/Rate Route Frequency Ordered Stop   05/15/13 1530  ceFAZolin (ANCEF) IVPB 1 g/50 mL premix     1 g 100 mL/hr over 30 Minutes Intravenous Every 8 hours 05/15/13 1116 05/15/13 2303   05/15/13 0812  bacitracin 50,000 Units in sodium chloride irrigation 0.9 % 500 mL irrigation  Status:  Discontinued       As needed 05/15/13 0812 05/15/13 0937   05/15/13 0600  ceFAZolin (ANCEF) 3 g in dextrose 5 % 50 mL IVPB     3 g 160 mL/hr over 30 Minutes Intravenous On call to O.R. 05/14/13 1429 05/15/13 0735      Discharge Exam: Blood pressure 105/66, pulse 63, temperature 97.5 F (36.4 C), temperature source Oral, resp. rate 18, SpO2 96.00%. Neurologic: Grossly normal Incision clean dry and intact  Discharge Medications:     Medication List  aspirin 325 MG tablet  Take 162.5 mg by mouth daily.     atenolol 50 MG tablet  Commonly known as:  TENORMIN  Take 50 mg by mouth daily.     HYDROcodone-ibuprofen 7.5-200 MG per tablet  Commonly known as:  VICOPROFEN  Take 1 tablet by mouth every 4 (four) hours as needed for moderate pain.     losartan 50 MG tablet  Commonly known as:  COZAAR  Take 50 mg by mouth daily.     meloxicam 15 MG tablet  Commonly known as:  MOBIC  Take 15 mg by mouth daily.     metFORMIN 500 MG tablet  Commonly known as:  GLUCOPHAGE  Take 500 mg by mouth 2 (two) times daily with a meal.     omeprazole 20 MG capsule  Commonly known as:  PRILOSEC  Take 20 mg by mouth 2 (two) times  daily before a meal.     POTASSIMIN PO  Take 1 tablet by mouth daily. OTC store brand     pravastatin 40 MG tablet  Commonly known as:  PRAVACHOL  Take 40 mg by mouth daily.     triamterene-hydrochlorothiazide 37.5-25 MG per tablet  Commonly known as:  MAXZIDE-25  Take 1 tablet by mouth daily.     VITAMIN D PO  Take 1 tablet by mouth daily.        Disposition: Home   Final Dx: Lumbar laminectomy      Discharge Orders   Future Orders Complete By Expires   Call MD for:  difficulty breathing, headache or visual disturbances  As directed    Call MD for:  persistant nausea and vomiting  As directed    Call MD for:  redness, tenderness, or signs of infection (pain, swelling, redness, odor or green/yellow discharge around incision site)  As directed    Call MD for:  severe uncontrolled pain  As directed    Call MD for:  temperature >100.4  As directed    Diet - low sodium heart healthy  As directed    Discharge instructions  As directed    Increase activity slowly  As directed    Remove dressing in 48 hours  As directed       Follow-up Information   Follow up with CRAM,GARY P, MD In 2 weeks.   Specialty:  Neurosurgery   Contact information:   [address] [place] [address] [phone]        Signed: Tia AlertDavid S Eaton Folmar 05/16/2013, 7:52 AM

## 2013-05-16 NOTE — Discharge Instructions (Signed)

## 2013-05-19 ENCOUNTER — Encounter (HOSPITAL_COMMUNITY): Payer: Self-pay | Admitting: Neurosurgery

## 2014-05-21 ENCOUNTER — Other Ambulatory Visit: Payer: Self-pay | Admitting: Neurosurgery

## 2014-06-09 ENCOUNTER — Encounter (HOSPITAL_COMMUNITY)
Admission: RE | Admit: 2014-06-09 | Discharge: 2014-06-09 | Disposition: A | Discharge status: Home / Self Care | Payer: Worker's Compensation | Source: Ambulatory Visit | Attending: Neurosurgery | Admitting: Neurosurgery

## 2014-06-09 ENCOUNTER — Encounter (HOSPITAL_COMMUNITY): Payer: Self-pay

## 2014-06-09 DIAGNOSIS — E119 Type 2 diabetes mellitus without complications: Secondary | ICD-10-CM | POA: Insufficient documentation

## 2014-06-09 DIAGNOSIS — Z01812 Encounter for preprocedural laboratory examination: Secondary | ICD-10-CM | POA: Insufficient documentation

## 2014-06-09 DIAGNOSIS — Z01810 Encounter for preprocedural cardiovascular examination: Secondary | ICD-10-CM | POA: Insufficient documentation

## 2014-06-09 DIAGNOSIS — I10 Essential (primary) hypertension: Secondary | ICD-10-CM | POA: Diagnosis not present

## 2014-06-09 HISTORY — DX: Cardiac arrhythmia, unspecified: I49.9

## 2014-06-09 HISTORY — DX: Unspecified osteoarthritis, unspecified site: M19.90

## 2014-06-09 LAB — CBC
HCT: 43.3 % (ref 39.0–52.0)
Hemoglobin: 14.6 g/dL (ref 13.0–17.0)
MCH: 27.6 pg (ref 26.0–34.0)
MCHC: 33.7 g/dL (ref 30.0–36.0)
MCV: 81.9 fL (ref 78.0–100.0)
PLATELETS: 107 10*3/uL — AB (ref 150–400)
RBC: 5.29 MIL/uL (ref 4.22–5.81)
RDW: 15 % (ref 11.5–15.5)
WBC: 7.7 10*3/uL (ref 4.0–10.5)

## 2014-06-09 LAB — BASIC METABOLIC PANEL
Anion gap: 10 (ref 5–15)
BUN: 22 mg/dL — ABNORMAL HIGH (ref 6–20)
CO2: 23 mmol/L (ref 22–32)
CREATININE: 1.02 mg/dL (ref 0.61–1.24)
Calcium: 10 mg/dL (ref 8.9–10.3)
Chloride: 107 mmol/L (ref 101–111)
GFR calc Af Amer: >60 mL/min (ref >60)
GFR calc non Af Amer: >60 mL/min (ref >60)
GLUCOSE: 107 mg/dL — AB (ref 65–99)
Potassium: 3.8 mmol/L (ref 3.5–5.1)
Sodium: 140 mmol/L (ref 135–145)

## 2014-06-09 LAB — SURGICAL PCR SCREEN
MRSA, PCR: NEGATIVE
STAPHYLOCOCCUS AUREUS: NEGATIVE

## 2014-06-09 LAB — GLUCOSE, CAPILLARY: Glucose-Capillary: 108 mg/dL — ABNORMAL HIGH (ref 65–99)

## 2014-06-09 NOTE — Progress Notes (Signed)
PCP is Brandon Pena and patient informed Nurse that his LOV was in January of this year. Will request EKG and LOV. Patient informed Nurse that he had a stress test with Dr. Lorelei PontAsif Wahid at [facility] in [place], [place] because he had some "atrial fibrillation." Will request records. Patient denied having any acute cardiac or pulmonary issues.  Nurse asked patient about fasting glucose and patient stated that he does not regularly check his sugars unless he is feeling bad. Patient informed Nurse that the highest his blood glucose has been was 141 and the lowest was 98. Blood glucose this morning upon arrival to PAT was 108. Patient denied having consumed any food this morning, but stated he had some water to take some pills with. Nurse taught in detail the importance of regularly checking his blood glucose, and also diabetes education sheet was provided.  Nurse instructed patient to bring CPAP mask DOS. Patient verbalized understanding.

## 2014-06-09 NOTE — Pre-Procedure Instructions (Signed)
Idelle JoCharlie W Coleman  06/09/2014     [facility] [address]- [place], [place] - [address], [id number] [place] [id number] Phone: [phone] Fax: [phone]    Your procedure is scheduled on : Friday June 18, 2014 at 10:16 AM.  [facility] 8:15 [facility].M.  Call this number if you have problems the morning of surgery: [phone]    Remember:  Do not eat food or drink liquids after midnight.  Take these medicines the morning of surgery with A SIP OF WATER: Atenolol (Tenormin), Omeprazole (Prilosec)   Do NOT take any diabetic pills the morning of your surgery (Ex. Metformin/Glucophage)   Stop taking any vitamins, herbal medications, Meloxicam, Ibuprofen, Advil, Motrin, Aleve, etc on Friday May 27th   Do not wear jewelry.  Do not wear lotions, powders, or cologne.    Men may shave face and neck.  Do not bring valuables to the hospital.  [facility] is not responsible for any belongings or valuables.  Contacts, dentures or bridgework may not be worn into surgery.  Leave your suitcase in the car.  After surgery it may be brought to your room.  For patients admitted to the hospital, discharge time will be determined by your treatment team.  Patients discharged the day of surgery will not be allowed to drive home.   Name and phone number of your driver:    Special instructions: Shower using CHG soap the night before and the morning of your surgery  Please read over the following fact sheets that you were given. Pain Booklet, Coughing and Deep Breathing, MRSA Information and Surgical Site Infection Prevention

## 2014-06-10 LAB — HEMOGLOBIN A1C
HEMOGLOBIN A1C: 6.1 % — AB (ref 4.8–5.6)
Mean Plasma Glucose: 128 mg/dL

## 2014-06-10 NOTE — Progress Notes (Signed)
Anesthesia Chart Review:  Patient is a 59 year old male scheduled for redo L4-5 microdiscectomy on 06/18/14 by Dr. Wynetta Emeryram.  History includes non-smoker, HTN, hypercholesterolemia, PAF (single episode ~ '11), OSA with CPAP use, DM2, GERD, L4-5 laminectomy/microdiskectomy '15. BMI is consistent with morbid obesity. PCP is Dr. Malachi ParadiseHarry Alexander. PCP note from 02/10/14 also mention a history of fatty liver and thrombocytopenia. He is no longer followed by cardiology but was previously evaluated ~ 2011 for PAF by Dr. Lorelei PontAsif Wahid with [facility].  Meds include ASA, atenolol, Cozaar, metformin, Prilosec, potassium, pravastatin, Maxzide.  Patient thought he had an EKG within the past year; however, EKG received was from 02/05/13. Last EKG currently available is from 05/07/13 and showed NSR.  06/09/09 Dobutamine stress echo [facility]): Normal resting wall motion and no stress-induced wall motion abnormality. Normal LVF at rest. No wall motion abnormalities noted at adequate work load.  03/24/09 Echo [facility]): No regional wall motion abnormalities. Normal LV systolic function. No pericardial effusion.  Preoperative labs noted. A1C 6.1. PLT 107K (previously 112K on 05/07/13).   Patient's EKG is > 60 year old, so he will need an EKG on arrival.  Historically, he has been maintaining SR. He is on b-blocker therapy.  If EKG is stable then I would anticipate that he could proceed as planned.  Further evaluation by his assigned anesthesiologist on the day of surgery.  Velna Ochsllison Balraj Brayfield, PA-C [facility]Anesthesiology Phone [phone] 06/10/2014 3:06 PM

## 2014-06-17 MED ORDER — DEXTROSE 5 % IV SOLN
3.0000 g | INTRAVENOUS | Status: AC
  Administered 2014-06-18: 3 g via INTRAVENOUS
  Filled 2014-06-17: qty 3000

## 2014-06-17 MED ORDER — DEXAMETHASONE SODIUM PHOSPHATE 10 MG/ML IJ SOLN
10.0000 mg | INTRAMUSCULAR | Status: AC
  Administered 2014-06-18: 10 mg via INTRAVENOUS
  Filled 2014-06-17: qty 1

## 2014-06-18 ENCOUNTER — Encounter (HOSPITAL_COMMUNITY)
Admission: RE | Disposition: A | Discharge status: Home / Self Care | Payer: Self-pay | Source: Ambulatory Visit | Attending: Neurosurgery

## 2014-06-18 ENCOUNTER — Ambulatory Visit (HOSPITAL_COMMUNITY): Payer: Worker's Compensation | Admitting: Vascular Surgery

## 2014-06-18 ENCOUNTER — Ambulatory Visit (HOSPITAL_COMMUNITY): Payer: Worker's Compensation

## 2014-06-18 ENCOUNTER — Encounter (HOSPITAL_COMMUNITY): Payer: Self-pay | Admitting: *Deleted

## 2014-06-18 ENCOUNTER — Ambulatory Visit (HOSPITAL_COMMUNITY)
Admission: RE | Admit: 2014-06-18 | Discharge: 2014-06-19 | Disposition: A | Discharge status: Home / Self Care | Payer: Worker's Compensation | Source: Ambulatory Visit | Attending: Neurosurgery | Admitting: Neurosurgery

## 2014-06-18 ENCOUNTER — Ambulatory Visit (HOSPITAL_COMMUNITY): Payer: Worker's Compensation | Admitting: Certified Registered Nurse Anesthetist

## 2014-06-18 DIAGNOSIS — I4891 Unspecified atrial fibrillation: Secondary | ICD-10-CM | POA: Insufficient documentation

## 2014-06-18 DIAGNOSIS — I10 Essential (primary) hypertension: Secondary | ICD-10-CM | POA: Diagnosis not present

## 2014-06-18 DIAGNOSIS — E119 Type 2 diabetes mellitus without complications: Secondary | ICD-10-CM | POA: Insufficient documentation

## 2014-06-18 DIAGNOSIS — M5020 Other cervical disc displacement, unspecified cervical region: Secondary | ICD-10-CM | POA: Diagnosis present

## 2014-06-18 DIAGNOSIS — E780 Pure hypercholesterolemia: Secondary | ICD-10-CM | POA: Diagnosis not present

## 2014-06-18 DIAGNOSIS — Z419 Encounter for procedure for purposes other than remedying health state, unspecified: Secondary | ICD-10-CM

## 2014-06-18 DIAGNOSIS — K219 Gastro-esophageal reflux disease without esophagitis: Secondary | ICD-10-CM | POA: Insufficient documentation

## 2014-06-18 DIAGNOSIS — M5126 Other intervertebral disc displacement, lumbar region: Secondary | ICD-10-CM | POA: Insufficient documentation

## 2014-06-18 DIAGNOSIS — Z7982 Long term (current) use of aspirin: Secondary | ICD-10-CM | POA: Diagnosis not present

## 2014-06-18 DIAGNOSIS — G4730 Sleep apnea, unspecified: Secondary | ICD-10-CM | POA: Insufficient documentation

## 2014-06-18 DIAGNOSIS — G8929 Other chronic pain: Secondary | ICD-10-CM | POA: Diagnosis not present

## 2014-06-18 HISTORY — PX: LUMBAR LAMINECTOMY/DECOMPRESSION MICRODISCECTOMY: SHX5026

## 2014-06-18 LAB — GLUCOSE, CAPILLARY
GLUCOSE-CAPILLARY: 165 mg/dL — AB (ref 65–99)
Glucose-Capillary: 116 mg/dL — ABNORMAL HIGH (ref 65–99)
Glucose-Capillary: 152 mg/dL — ABNORMAL HIGH (ref 65–99)
Glucose-Capillary: 142 mg/dL — ABNORMAL HIGH (ref 65–99)

## 2014-06-18 SURGERY — LUMBAR LAMINECTOMY/DECOMPRESSION MICRODISCECTOMY 1 LEVEL
Anesthesia: General | Site: Back | Laterality: Left

## 2014-06-18 MED ORDER — PROMETHAZINE HCL 25 MG/ML IJ SOLN: 6.2500 mg | INTRAMUSCULAR | Status: DC | PRN

## 2014-06-18 MED ORDER — ROCURONIUM BROMIDE 50 MG/5ML IV SOLN
INTRAVENOUS | Status: AC
  Filled 2014-06-18: qty 1

## 2014-06-18 MED ORDER — TRIAMTERENE-HCTZ 37.5-25 MG PO TABS
1.0000 | ORAL_TABLET | Freq: Every day | ORAL | Status: DC
  Administered 2014-06-18 – 2014-06-19 (×2): 1 via ORAL
  Filled 2014-06-18 (×2): qty 1

## 2014-06-18 MED ORDER — OXYCODONE-ACETAMINOPHEN 5-325 MG PO TABS: 1.0000 | ORAL_TABLET | ORAL | Status: DC | PRN

## 2014-06-18 MED ORDER — CEFAZOLIN SODIUM 1-5 GM-% IV SOLN
1.0000 g | Freq: Three times a day (TID) | INTRAVENOUS | Status: AC
  Administered 2014-06-18 – 2014-06-19 (×2): 1 g via INTRAVENOUS
  Filled 2014-06-18 (×2): qty 50

## 2014-06-18 MED ORDER — THROMBIN 5000 UNITS EX SOLR
CUTANEOUS | Status: DC | PRN
  Administered 2014-06-18 (×2): 5000 [IU] via TOPICAL

## 2014-06-18 MED ORDER — SODIUM CHLORIDE 0.9 % IJ SOLN
3.0000 mL | Freq: Two times a day (BID) | INTRAMUSCULAR | Status: DC
  Administered 2014-06-18: 3 mL via INTRAVENOUS

## 2014-06-18 MED ORDER — ASPIRIN 81 MG PO CHEW
162.0000 mg | CHEWABLE_TABLET | Freq: Every day | ORAL | Status: DC
  Administered 2014-06-19: 162 mg via ORAL
  Filled 2014-06-18: qty 2

## 2014-06-18 MED ORDER — LIDOCAINE HCL (CARDIAC) 20 MG/ML IV SOLN
INTRAVENOUS | Status: AC
  Filled 2014-06-18: qty 5

## 2014-06-18 MED ORDER — LACTATED RINGERS IV SOLN
INTRAVENOUS | Status: DC
  Administered 2014-06-18 (×3): via INTRAVENOUS

## 2014-06-18 MED ORDER — HYDROCODONE-IBUPROFEN 7.5-200 MG PO TABS: 1.0000 | ORAL_TABLET | ORAL | Status: DC | PRN

## 2014-06-18 MED ORDER — LOSARTAN POTASSIUM 50 MG PO TABS
50.0000 mg | ORAL_TABLET | Freq: Every day | ORAL | Status: DC
Start: 2014-06-18 — End: 2014-06-19
  Administered 2014-06-18: 50 mg via ORAL
  Filled 2014-06-18 (×2): qty 1

## 2014-06-18 MED ORDER — LIDOCAINE-EPINEPHRINE 1 %-1:100000 IJ SOLN
INTRAMUSCULAR | Status: DC | PRN
  Administered 2014-06-18: 10 mL

## 2014-06-18 MED ORDER — HYDROCODONE-ACETAMINOPHEN 7.5-325 MG PO TABS
1.0000 | ORAL_TABLET | ORAL | Status: DC | PRN
  Administered 2014-06-19: 1 via ORAL
  Filled 2014-06-18: qty 1

## 2014-06-18 MED ORDER — HYDROMORPHONE HCL 1 MG/ML IJ SOLN: 0.2500 mg | INTRAMUSCULAR | Status: DC | PRN

## 2014-06-18 MED ORDER — GLYCOPYRROLATE 0.2 MG/ML IJ SOLN
INTRAMUSCULAR | Status: DC | PRN
  Administered 2014-06-18: .8 mg via INTRAVENOUS

## 2014-06-18 MED ORDER — CYCLOBENZAPRINE HCL 10 MG PO TABS: 10.0000 mg | ORAL_TABLET | Freq: Three times a day (TID) | ORAL | Status: DC | PRN

## 2014-06-18 MED ORDER — PROPOFOL 10 MG/ML IV BOLUS
INTRAVENOUS | Status: AC
  Filled 2014-06-18: qty 20

## 2014-06-18 MED ORDER — PHENYLEPHRINE HCL 10 MG/ML IJ SOLN
INTRAMUSCULAR | Status: DC | PRN
  Administered 2014-06-18 (×4): 80 ug via INTRAVENOUS

## 2014-06-18 MED ORDER — SODIUM CHLORIDE 0.9 % IJ SOLN: 3.0000 mL | INTRAMUSCULAR | Status: DC | PRN

## 2014-06-18 MED ORDER — FENTANYL CITRATE (PF) 250 MCG/5ML IJ SOLN
INTRAMUSCULAR | Status: AC
  Filled 2014-06-18: qty 5

## 2014-06-18 MED ORDER — OXYCODONE-ACETAMINOPHEN 5-325 MG PO TABS
1.0000 | ORAL_TABLET | ORAL | Status: DC | PRN
  Administered 2014-06-18 (×2): 2 via ORAL
  Filled 2014-06-18 (×2): qty 2

## 2014-06-18 MED ORDER — PRAVASTATIN SODIUM 40 MG PO TABS
40.0000 mg | ORAL_TABLET | Freq: Every day | ORAL | Status: DC
  Administered 2014-06-18 – 2014-06-19 (×2): 40 mg via ORAL
  Filled 2014-06-18 (×2): qty 1

## 2014-06-18 MED ORDER — MIDAZOLAM HCL 2 MG/2ML IJ SOLN
INTRAMUSCULAR | Status: AC
  Filled 2014-06-18: qty 2

## 2014-06-18 MED ORDER — FENTANYL CITRATE (PF) 100 MCG/2ML IJ SOLN
INTRAMUSCULAR | Status: DC | PRN
  Administered 2014-06-18: 100 ug via INTRAVENOUS
  Administered 2014-06-18: 50 ug via INTRAVENOUS
  Administered 2014-06-18: 200 ug via INTRAVENOUS

## 2014-06-18 MED ORDER — MENTHOL 3 MG MT LOZG: 1.0000 | LOZENGE | OROMUCOSAL | Status: DC | PRN

## 2014-06-18 MED ORDER — POTASSIUM 75 MG PO TABS: 20.0000 | ORAL_TABLET | Freq: Every day | ORAL | Status: DC

## 2014-06-18 MED ORDER — CHOLECALCIFEROL 10 MCG (400 UNIT) PO TABS
400.0000 [IU] | ORAL_TABLET | Freq: Every day | ORAL | Status: DC
  Administered 2014-06-18 – 2014-06-19 (×2): 400 [IU] via ORAL
  Filled 2014-06-18 (×2): qty 1

## 2014-06-18 MED ORDER — SUCCINYLCHOLINE CHLORIDE 20 MG/ML IJ SOLN
INTRAMUSCULAR | Status: AC
  Filled 2014-06-18: qty 1

## 2014-06-18 MED ORDER — PANTOPRAZOLE SODIUM 40 MG PO TBEC
40.0000 mg | DELAYED_RELEASE_TABLET | Freq: Every day | ORAL | Status: DC
Start: 2014-06-19 — End: 2014-06-19

## 2014-06-18 MED ORDER — NEOSTIGMINE METHYLSULFATE 10 MG/10ML IV SOLN
INTRAVENOUS | Status: DC | PRN
  Administered 2014-06-18: 5 mg via INTRAVENOUS

## 2014-06-18 MED ORDER — DOCUSATE SODIUM 100 MG PO CAPS
100.0000 mg | ORAL_CAPSULE | Freq: Two times a day (BID) | ORAL | Status: DC
  Administered 2014-06-18: 100 mg via ORAL
  Filled 2014-06-18: qty 1

## 2014-06-18 MED ORDER — MELOXICAM 15 MG PO TABS
15.0000 mg | ORAL_TABLET | Freq: Every day | ORAL | Status: DC
Start: 2014-06-18 — End: 2014-06-19
  Administered 2014-06-18 – 2014-06-19 (×2): 15 mg via ORAL
  Filled 2014-06-18 (×2): qty 1

## 2014-06-18 MED ORDER — ONDANSETRON HCL 4 MG/2ML IJ SOLN
INTRAMUSCULAR | Status: DC | PRN
  Administered 2014-06-18: 4 mg via INTRAVENOUS

## 2014-06-18 MED ORDER — PHENOL 1.4 % MT LIQD: 1.0000 | OROMUCOSAL | Status: DC | PRN

## 2014-06-18 MED ORDER — HEMOSTATIC AGENTS (NO CHARGE) OPTIME
TOPICAL | Status: DC | PRN
  Administered 2014-06-18: 1 via TOPICAL

## 2014-06-18 MED ORDER — MEPERIDINE HCL 25 MG/ML IJ SOLN: 6.2500 mg | INTRAMUSCULAR | Status: DC | PRN

## 2014-06-18 MED ORDER — ONDANSETRON HCL 4 MG/2ML IJ SOLN
INTRAMUSCULAR | Status: AC
  Filled 2014-06-18: qty 2

## 2014-06-18 MED ORDER — SODIUM CHLORIDE 0.9 % IV SOLN: 250.0000 mL | INTRAVENOUS | Status: DC

## 2014-06-18 MED ORDER — PNEUMOCOCCAL VAC POLYVALENT 25 MCG/0.5ML IJ INJ
0.5000 mL | INJECTION | INTRAMUSCULAR | Status: DC
  Filled 2014-06-18: qty 0.5

## 2014-06-18 MED ORDER — ATENOLOL 50 MG PO TABS
50.0000 mg | ORAL_TABLET | Freq: Every day | ORAL | Status: DC
  Filled 2014-06-18: qty 1

## 2014-06-18 MED ORDER — ACETAMINOPHEN 325 MG PO TABS: 650.0000 mg | ORAL_TABLET | ORAL | Status: DC | PRN

## 2014-06-18 MED ORDER — LIDOCAINE HCL (CARDIAC) 20 MG/ML IV SOLN
INTRAVENOUS | Status: AC
  Filled 2014-06-18: qty 10

## 2014-06-18 MED ORDER — MIDAZOLAM HCL 5 MG/5ML IJ SOLN
INTRAMUSCULAR | Status: DC | PRN
  Administered 2014-06-18: 2 mg via INTRAVENOUS

## 2014-06-18 MED ORDER — MIDAZOLAM HCL 2 MG/2ML IJ SOLN: 0.5000 mg | Freq: Once | INTRAMUSCULAR | Status: DC | PRN

## 2014-06-18 MED ORDER — ACETAMINOPHEN 650 MG RE SUPP: 650.0000 mg | RECTAL | Status: DC | PRN

## 2014-06-18 MED ORDER — HYDROMORPHONE HCL 1 MG/ML IJ SOLN: 0.5000 mg | INTRAMUSCULAR | Status: DC | PRN

## 2014-06-18 MED ORDER — METFORMIN HCL 500 MG PO TABS
500.0000 mg | ORAL_TABLET | Freq: Two times a day (BID) | ORAL | Status: DC
  Administered 2014-06-18 – 2014-06-19 (×2): 500 mg via ORAL
  Filled 2014-06-18 (×4): qty 1

## 2014-06-18 MED ORDER — BUPIVACAINE HCL (PF) 0.25 % IJ SOLN
INTRAMUSCULAR | Status: DC | PRN
  Administered 2014-06-18: 20 mL

## 2014-06-18 MED ORDER — GLYCOPYRROLATE 0.2 MG/ML IJ SOLN
INTRAMUSCULAR | Status: AC
  Filled 2014-06-18: qty 4

## 2014-06-18 MED ORDER — 0.9 % SODIUM CHLORIDE (POUR BTL) OPTIME
TOPICAL | Status: DC | PRN
  Administered 2014-06-18: 1000 mL

## 2014-06-18 MED ORDER — SODIUM CHLORIDE 0.9 % IR SOLN
Status: DC | PRN
  Administered 2014-06-18: 500 mL

## 2014-06-18 MED ORDER — ARTIFICIAL TEARS OP OINT
TOPICAL_OINTMENT | OPHTHALMIC | Status: AC
  Filled 2014-06-18: qty 7

## 2014-06-18 MED ORDER — PROPOFOL 10 MG/ML IV BOLUS
INTRAVENOUS | Status: DC | PRN
  Administered 2014-06-18: 200 mg via INTRAVENOUS

## 2014-06-18 MED ORDER — ONDANSETRON HCL 4 MG/2ML IJ SOLN: 4.0000 mg | INTRAMUSCULAR | Status: DC | PRN

## 2014-06-18 MED ORDER — LIDOCAINE HCL (CARDIAC) 20 MG/ML IV SOLN
INTRAVENOUS | Status: DC | PRN
  Administered 2014-06-18: 20 mg via INTRAVENOUS

## 2014-06-18 MED ORDER — ROCURONIUM BROMIDE 100 MG/10ML IV SOLN
INTRAVENOUS | Status: DC | PRN
  Administered 2014-06-18: 50 mg via INTRAVENOUS

## 2014-06-18 SURGICAL SUPPLY — 52 items
BAG DECANTER FOR FLEXI CONT (MISCELLANEOUS) ×3 IMPLANT
BENZOIN TINCTURE PRP APPL 2/3 (GAUZE/BANDAGES/DRESSINGS) ×3 IMPLANT
BLADE CLIPPER SURG (BLADE) ×3 IMPLANT
BLADE SURG 11 STRL SS (BLADE) ×3 IMPLANT
BRUSH SCRUB EZ PLAIN DRY (MISCELLANEOUS) ×3 IMPLANT
BUR MATCHSTICK NEURO 3.0 LAGG (BURR) ×3 IMPLANT
BUR PRECISION FLUTE 6.0 (BURR) ×3 IMPLANT
CANISTER SUCT 3000ML PPV (MISCELLANEOUS) ×3 IMPLANT
CLOSURE WOUND 1/2 X4 (GAUZE/BANDAGES/DRESSINGS) ×1
CONT SPEC 4OZ CLIKSEAL STRL BL (MISCELLANEOUS) ×3 IMPLANT
DECANTER SPIKE VIAL GLASS SM (MISCELLANEOUS) ×3 IMPLANT
DRAPE LAPAROTOMY 100X72X124 (DRAPES) ×3 IMPLANT
DRAPE MICROSCOPE LEICA (MISCELLANEOUS) ×3 IMPLANT
DRAPE POUCH INSTRU U-SHP 10X18 (DRAPES) ×3 IMPLANT
DRAPE PROXIMA HALF (DRAPES) IMPLANT
DRAPE SURG 17X23 STRL (DRAPES) ×3 IMPLANT
DRSG OPSITE 4X5.5 SM (GAUZE/BANDAGES/DRESSINGS) IMPLANT
DRSG OPSITE POSTOP 4X6 (GAUZE/BANDAGES/DRESSINGS) ×3 IMPLANT
DURAPREP 26ML APPLICATOR (WOUND CARE) ×3 IMPLANT
ELECT REM PT RETURN 9FT ADLT (ELECTROSURGICAL) ×3
ELECTRODE REM PT RTRN 9FT ADLT (ELECTROSURGICAL) ×1 IMPLANT
GAUZE SPONGE 4X4 12PLY STRL (GAUZE/BANDAGES/DRESSINGS) IMPLANT
GAUZE SPONGE 4X4 16PLY XRAY LF (GAUZE/BANDAGES/DRESSINGS) IMPLANT
GLOVE BIO SURGEON STRL SZ8 (GLOVE) ×3 IMPLANT
GLOVE EXAM NITRILE LRG STRL (GLOVE) IMPLANT
GLOVE EXAM NITRILE MD LF STRL (GLOVE) IMPLANT
GLOVE EXAM NITRILE XL STR (GLOVE) IMPLANT
GLOVE EXAM NITRILE XS STR PU (GLOVE) IMPLANT
GLOVE INDICATOR 8.5 STRL (GLOVE) ×3 IMPLANT
GOWN STRL REUS W/ TWL LRG LVL3 (GOWN DISPOSABLE) ×2 IMPLANT
GOWN STRL REUS W/ TWL XL LVL3 (GOWN DISPOSABLE) ×2 IMPLANT
GOWN STRL REUS W/TWL 2XL LVL3 (GOWN DISPOSABLE) IMPLANT
GOWN STRL REUS W/TWL LRG LVL3 (GOWN DISPOSABLE) ×4
GOWN STRL REUS W/TWL XL LVL3 (GOWN DISPOSABLE) ×4
KIT BASIN OR (CUSTOM PROCEDURE TRAY) ×3 IMPLANT
KIT ROOM TURNOVER OR (KITS) ×3 IMPLANT
LIQUID BAND (GAUZE/BANDAGES/DRESSINGS) ×3 IMPLANT
NEEDLE HYPO 22GX1.5 SAFETY (NEEDLE) ×3 IMPLANT
NEEDLE SPNL 22GX3.5 QUINCKE BK (NEEDLE) ×3 IMPLANT
NS IRRIG 1000ML POUR BTL (IV SOLUTION) ×3 IMPLANT
PACK LAMINECTOMY NEURO (CUSTOM PROCEDURE TRAY) ×3 IMPLANT
RUBBERBAND STERILE (MISCELLANEOUS) ×6 IMPLANT
SPONGE SURGIFOAM ABS GEL SZ50 (HEMOSTASIS) ×3 IMPLANT
STRIP CLOSURE SKIN 1/2X4 (GAUZE/BANDAGES/DRESSINGS) ×2 IMPLANT
SUT VIC AB 0 CT1 18XCR BRD8 (SUTURE) ×1 IMPLANT
SUT VIC AB 0 CT1 8-18 (SUTURE) ×2
SUT VIC AB 2-0 CT1 18 (SUTURE) ×3 IMPLANT
SUT VICRYL 4-0 PS2 18IN ABS (SUTURE) ×3 IMPLANT
SYR 20ML ECCENTRIC (SYRINGE) ×3 IMPLANT
TOWEL OR 17X24 6PK STRL BLUE (TOWEL DISPOSABLE) ×3 IMPLANT
TOWEL OR 17X26 10 PK STRL BLUE (TOWEL DISPOSABLE) ×3 IMPLANT
WATER STERILE IRR 1000ML POUR (IV SOLUTION) ×3 IMPLANT

## 2014-06-18 NOTE — Transfer of Care (Signed)
Immediate Anesthesia Transfer of Care Note  Patient: Brandon Pena  Procedure(s) Performed: Procedure(s): Microdiscectomy - L4-L5 - left redo (Left)  Patient Location: PACU  Anesthesia Type:General  Level of Consciousness: awake, alert  and oriented  Airway & Oxygen Therapy: Patient Spontanous Breathing and Patient connected to face mask oxygen  Post-op Assessment: Report given to RN and Post -op Vital signs reviewed and stable  Post vital signs: Reviewed and stable  Last Vitals:  Filed Vitals:   06/18/14 0826  BP: 148/95  Pulse: 70  Temp: 36.9 C  Resp: 20    Complications: No apparent anesthesia complications

## 2014-06-18 NOTE — Progress Notes (Signed)
Placed pt on cpap for the night, pt tolerating well at this time. RT placed pt on home settings

## 2014-06-18 NOTE — Anesthesia Preprocedure Evaluation (Addendum)
Anesthesia Evaluation  Patient identified by MRN, date of birth, ID band Patient awake    Reviewed: Allergy & Precautions, NPO status , Patient's Chart, lab work & pertinent test results, reviewed documented beta blocker date and time   History of Anesthesia Complications Negative for: history of anesthetic complications  Airway Mallampati: IV  TM Distance: >3 FB Neck ROM: Full    Dental  (+) Dental Advisory Given   Pulmonary sleep apnea and Continuous Positive Airway Pressure Ventilation ,  breath sounds clear to auscultation        Cardiovascular hypertension, Pt. on medications and Pt. on home beta blockers - angina+ dysrhythmias Atrial Fibrillation Rhythm:Irregular Rate:Normal  '11 ECHO: normal LVF, valves OK '11 stress test: normal LV perfusion   Neuro/Psych Chronic back pain    GI/Hepatic Neg liver ROS, GERD-  Medicated and Controlled,  Endo/Other  diabetes (glu 116), Oral Hypoglycemic AgentsMorbid obesity  Renal/GU negative Renal ROS     Musculoskeletal   Abdominal (+) + obese,   Peds  Hematology negative hematology ROS (+)   Anesthesia Other Findings   Reproductive/Obstetrics                            Anesthesia Physical Anesthesia Plan  ASA: III  Anesthesia Plan: General   Post-op Pain Management:    Induction: Intravenous  Airway Management Planned: Oral ETT and Video Laryngoscope Planned  Additional Equipment:   Intra-op Plan:   Post-operative Plan: Extubation in OR  Informed Consent: I have reviewed the patients History and Physical, chart, labs and discussed the procedure including the risks, benefits and alternatives for the proposed anesthesia with the patient or authorized representative who has indicated his/her understanding and acceptance.   Dental advisory given  Plan Discussed with: CRNA and Surgeon  Anesthesia Plan Comments: (Plan routine monitors,  GETA with VideoGlide intubation)        Anesthesia Quick Evaluation

## 2014-06-18 NOTE — Discharge Summary (Signed)
  Physician Discharge Summary  Patient ID: Brandon Pena MRN: [id number] DOB/AGE: March 30, 1955 59 y.o.  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  Discharge Diagnoses: Same Active Problems:   HNP (herniated nucleus pulposus), cervical   Discharged Condition: good  Hospital Course: Patient presented to the hospital as an early morning admission and hospital underwent the compressive laminectomy and fusion at L4-5 and a left postoperative patient did very well recovered in the floor on the floor he was angling and voiding spontaneously tolerating regular diet stable from discharged home  Consults: Significant Diagnostic Studies: Treatments: Redo decompressive laminectomy and discectomy L4-5 left Discharge Exam: Blood pressure 123/68, pulse 87, temperature 100.3 F (37.9 C), temperature source Oral, resp. rate 18, height 5\' 11"  (1.803 m), weight 151.048 kg (333 lb), SpO2 98 %. Strength out of 5 wound clean dry and intact  Disposition: Home     Medication List    TAKE these medications        aspirin 325 MG tablet  Take 162.5 mg by mouth daily.     atenolol 50 MG tablet  Commonly known as:  TENORMIN  Take 50 mg by mouth daily.     HYDROcodone-ibuprofen 7.5-200 MG per tablet  Commonly known as:  VICOPROFEN  Take 1 tablet by mouth every 4 (four) hours as needed for moderate pain.     losartan 50 MG tablet  Commonly known as:  COZAAR  Take 50 mg by mouth daily.     meloxicam 15 MG tablet  Commonly known as:  MOBIC  Take 15 mg by mouth daily.     metFORMIN 500 MG tablet  Commonly known as:  GLUCOPHAGE  Take 500 mg by mouth 2 (two) times daily with a meal.     omeprazole 20 MG capsule  Commonly known as:  PRILOSEC  Take 20 mg by mouth 2 (two) times daily before a meal.     oxyCODONE-acetaminophen 5-325 MG per tablet  Commonly known as:  PERCOCET/ROXICET  Take 1-2 tablets by mouth every 4  (four) hours as needed for moderate pain.     POTASSIMIN PO  Take 1 tablet by mouth daily. OTC store brand     pravastatin 40 MG tablet  Commonly known as:  PRAVACHOL  Take 40 mg by mouth daily.     triamterene-hydrochlorothiazide 37.5-25 MG per tablet  Commonly known as:  MAXZIDE-25  Take 1 tablet by mouth daily.     VITAMIN D PO  Take 1 tablet by mouth daily.           Follow-up Information    [facility] with [facility]Maggi Hershkowitz P, MD.   Specialty:  Neurosurgery   Contact information:   [address] [place] [address] [phone]       Signed: Mariam DollarCRAM,Hester Forget P 06/18/2014, 5:21 PM

## 2014-06-18 NOTE — Op Note (Signed)
Preoperative diagnosis: Left L5 radicular pain from herniated nucleus pulposus recurrent L4-5 left  Postoperative diagnosis: Same  Procedure: Redo lumbar laminectomy microdiscectomy L4-5 on the left with microdissection of the left L5 nerve root microscopic discectomy  Surgeon: Jillyn HiddenGary Saraann Enneking  Asst.: Lisbeth RenshawNeelesh Nundkumar  Anesthesia: Gen.  EBL: Minimal  History of present illness: Patient is a 59 year old gentleman sustained a work-related injury and developed a herniated nucleus pulposus at L4-5 in the left underwent laminectomy discectomy proximal me 1 year ago. Postoperatively patient did very well initially over the first few months but then started expense and worsening left leg radicular pain workup revealed recurrent discoloration L4-5 in the left. Patient failed physical therapy epidural sterile injections and inflammatory's and time. And due to patient's failure conservative treatment imaging findings and progressive clinical syndrome I recommended reexploration and redo laminectomy microdiscectomy at L4-5 on the left. I extensively went over the risks and benefits of the operation the patient as well as perioperative course expectations of outcome and alternatives of surgery and he understood and agreed to proceed.  Operative procedure: Patient wasbrought into the or was induced under general anesthesia positioned prone the Wilson frame his back was prepped and draped in routine sterile fashion is old incision was opened up and the scar tissues dissected free and subperiosteal dissection was carried out through the scar tissue we exposing the L4-5 laminotomy defect. Interoperative x-ray confirmed identification of appropriate level so the scar tissues dissected off of the residual laminotomy defect the laminotomy was extended cephalad caudally as well as medially native dura was identified and then marking from the L5 pedicle superiorly identify the disc space dissected the L5 nerve and scar tissue  off the disc space coag with the epidural veins incised the disc space and from within the disc space cleaned out and extensive of disc material and working up underneath the layer scar up underneath the 5 root at teased off a recurrent disc herniation at the level of the space causing severe stenosis and compression of the proximal L5 nerve root. Clean the disc space pituitary rongeurs Epstein curettes and nerve hooks. Again discectomies were further stenosis on thecal sac centrally or the L5 nerve root foraminally I extended the laminotomy distally was a Cobos irrigated meticulous in space was maintained Gelfoam was opened up the dura the muscle fascia proximally layers with Vicryls is closed running 4 subcuticular and Steri-Strips Dermabond were applied patient recovered in stable condition. At the end of case on the account sponge counts were correct.

## 2014-06-18 NOTE — Anesthesia Procedure Notes (Signed)
Procedure Name: Intubation Date/Time: 06/18/2014 10:16 AM Performed by: Dairl PonderJIANG, Jasmaine Rochel Pre-anesthesia Checklist: Patient identified, Timeout performed, Emergency Drugs available, Suction available and Patient being monitored Patient Re-evaluated:Patient Re-evaluated prior to inductionOxygen Delivery Method: Circle system utilized Preoxygenation: Pre-oxygenation with 100% oxygen Intubation Type: IV induction Ventilation: Oral airway inserted - appropriate to patient size, Mask ventilation without difficulty and Two handed mask ventilation required Laryngoscope Size: Glidescope and 4 Grade View: Grade IV Tube type: Oral Tube size: 7.5 mm Number of attempts: 1 Airway Equipment and Method: Stylet Placement Confirmation: ETT inserted through vocal cords under direct vision,  breath sounds checked- equal and bilateral and positive ETCO2 Secured at: 23 cm Tube secured with: Tape Dental Injury: Teeth and Oropharynx as per pre-operative assessment

## 2014-06-18 NOTE — Discharge Instructions (Signed)
No lifting no bending no twisting no driving a riding a car less E's, back and forth to see me. Keep the incision clean dry and intact.

## 2014-06-18 NOTE — H&P (Signed)
Brandon Pena is an 59 y.o. male.   Chief Complaint: Back and left leg pain HPI: Patient is a very pleasant 59 year old gentleman sustained a work-related injury and a disc herniation at L4-5 on the left. Patient underwent laminectomy and discectomy but 9 months ago initially did very well however a few months postoperative cervix parents in recurrent left leg pain workup revealed small recurrent disc herniation that we've attempted to treat conservatively with epidural sterile injections physical therapy and inflammatory's and time. Patient has failed all forms of conservative treatment and due to the imaging showing recurrent disc herniation for a conservative treatment progression clinical syndrome I recommended reexploration redo discectomy L4-5 on the left. I extensively went over the risks and benefits of the operation with the patient as well as perioperative course expectations of outcome and alternatives of surgery and he understood and agreed to proceed forward.  Past Medical History  Diagnosis Date  . Hypercholesteremia   . Hypertension     Does not see a cardiologist  . History of atrial fibrillation     reports one time issue in 2012  . History of kidney stones   . GERD (gastroesophageal reflux disease)   . Sleep apnea     uses CPAP, last sleep study done 8-2437yrs. ago  . Dysrhythmia   . Type II diabetes mellitus     Type 2  . Arthritis     Past Surgical History  Procedure Laterality Date  . Knee arthroscopy Left 1980's  . Orif patella fracture Left 1980's  . Circumcision  1985  . Biceps tendon repair Right 1990's  . Lumbar laminectomy/decompression microdiscectomy Left 05/15/2013    Procedure: LUMBAR LAMINECTOMY/DECOMPRESSION MICRODISCECTOMY 1 LEVEL;  Surgeon: Mariam DollarGary P Careem Yasui, MD;  Location: [facility];  Service: Neurosurgery;  Laterality: Left;  LUMBAR LAMINECTOMY/DECOMPRESSION MICRODISCECTOMY 1 LEVEL L4-5  . Colonoscopy w/ polypectomy      History reviewed. No pertinent  family history. Social History:  reports that he has never smoked. He has never used smokeless tobacco. He reports that he does not drink alcohol or use illicit drugs.  Allergies: No Known Allergies  Medications Prior to Admission  Medication Sig Dispense Refill  . aspirin 325 MG tablet Take 162.5 mg by mouth daily.    Marland Kitchen. atenolol (TENORMIN) 50 MG tablet Take 50 mg by mouth daily.    . Cholecalciferol (VITAMIN D PO) Take 1 tablet by mouth daily.    Marland Kitchen. HYDROcodone-ibuprofen (VICOPROFEN) 7.5-200 MG per tablet Take 1 tablet by mouth every 4 (four) hours as needed for moderate pain. 60 tablet 0  . losartan (COZAAR) 50 MG tablet Take 50 mg by mouth daily.    . meloxicam (MOBIC) 15 MG tablet Take 15 mg by mouth daily.    . metFORMIN (GLUCOPHAGE) 500 MG tablet Take 500 mg by mouth 2 (two) times daily with a meal.    . omeprazole (PRILOSEC) 20 MG capsule Take 20 mg by mouth 2 (two) times daily before a meal.    . Potassium (POTASSIMIN PO) Take 1 tablet by mouth daily. OTC store brand    . pravastatin (PRAVACHOL) 40 MG tablet Take 40 mg by mouth daily.    Marland Kitchen. triamterene-hydrochlorothiazide (MAXZIDE-25) 37.5-25 MG per tablet Take 1 tablet by mouth daily.      Results for orders placed or performed during the hospital encounter of 06/18/14 (from the past 48 hour(s))  Glucose, capillary     Status: Abnormal   Collection Time: 06/18/14  8:28 AM  Result Value Ref Range   Glucose-Capillary 116 (H) 65 - 99 mg/dL   No results found.  Review of Systems  Constitutional: Negative.   HENT: Negative.   Eyes: Negative.   Respiratory: Negative.   Cardiovascular: Negative.   Gastrointestinal: Negative.   Genitourinary: Negative.   Musculoskeletal: Positive for myalgias and back pain.  Skin: Negative.   Neurological: Positive for tingling.  Endo/Heme/Allergies: Negative.   Psychiatric/Behavioral: Negative.     Blood pressure 148/95, pulse 70, temperature 98.4 F (36.9 C), temperature source Oral,  resp. rate 20, height  (1.803 m), weight 151.048 kg (333 lb), SpO2 97 %. Physical Exam  Constitutional: He is oriented to person, place, and time. He appears well-developed and well-nourished.  HENT:  Head: Normocephalic.  Eyes: Pupils are equal, round, and reactive to light.  Neck: Normal range of motion.  Musculoskeletal: Normal range of motion.  Neurological: He is alert and oriented to person, place, and time. He has normal strength. GCS eye subscore is 4. GCS verbal subscore is 5. GCS motor subscore is 6.  Strength is 5 out of 5 in his iliopsoas, quads, his shoes, gastric, and tibialis, and EHL.     Assessment/Plan 59 year old gentleman presents for redo laminectomy microdiscectomy L4-5 on the left.  Tiearra Colwell P 06/18/2014, 9:43 AM

## 2014-06-18 NOTE — Anesthesia Postprocedure Evaluation (Signed)
  Anesthesia Post-op Note  Patient: Brandon Pena  Procedure(s) Performed: Procedure(s): Microdiscectomy - L4-L5 - left redo (Left)  Patient Location: PACU  Anesthesia Type:General  Level of Consciousness: awake, alert , oriented and patient cooperative  Airway and Oxygen Therapy: Patient Spontanous Breathing and Patient connected to nasal cannula oxygen  Post-op Pain: none  Post-op Assessment: Post-op Vital signs reviewed, Patient's Cardiovascular Status Stable, Respiratory Function Stable, Patent Airway, No signs of Nausea or vomiting and Pain level controlled  Post-op Vital Signs: Reviewed and stable  Last Vitals:  Filed Vitals:   06/18/14 1311  BP:   Pulse: 80  Temp: 36.1 C  Resp: 17    Complications: No apparent anesthesia complications

## 2014-06-19 DIAGNOSIS — M5126 Other intervertebral disc displacement, lumbar region: Secondary | ICD-10-CM | POA: Diagnosis not present

## 2014-06-19 LAB — GLUCOSE, CAPILLARY: Glucose-Capillary: 126 mg/dL — ABNORMAL HIGH (ref 65–99)

## 2014-06-19 NOTE — Progress Notes (Signed)
Patient DC'd home via car with family.  DC instructions and prescription given to patient.  Both fully understood.  Vital signs and assessments were stable.  

## 2014-06-21 ENCOUNTER — Encounter (HOSPITAL_COMMUNITY): Payer: Self-pay | Admitting: Neurosurgery

## 2016-03-28 ENCOUNTER — Other Ambulatory Visit: Payer: Self-pay | Admitting: Orthopedic Surgery

## 2016-03-28 DIAGNOSIS — M25512 Pain in left shoulder: Secondary | ICD-10-CM

## 2016-03-29 ENCOUNTER — Ambulatory Visit
Admission: RE | Admit: 2016-03-29 | Discharge: 2016-03-29 | Disposition: A | Discharge status: Home / Self Care | Payer: Worker's Compensation | Source: Ambulatory Visit | Attending: Orthopedic Surgery | Admitting: Orthopedic Surgery

## 2016-03-29 DIAGNOSIS — M25512 Pain in left shoulder: Secondary | ICD-10-CM

## 2016-04-08 IMAGING — DX DG LUMBAR SPINE 1V
1 series · 1 of 1 positions shown · non-contrast
Comparison: 05/16/2003

CLINICAL DATA: L4-5 microdiscectomy

EXAM:
LUMBAR SPINE - 1 VIEW

[lat]
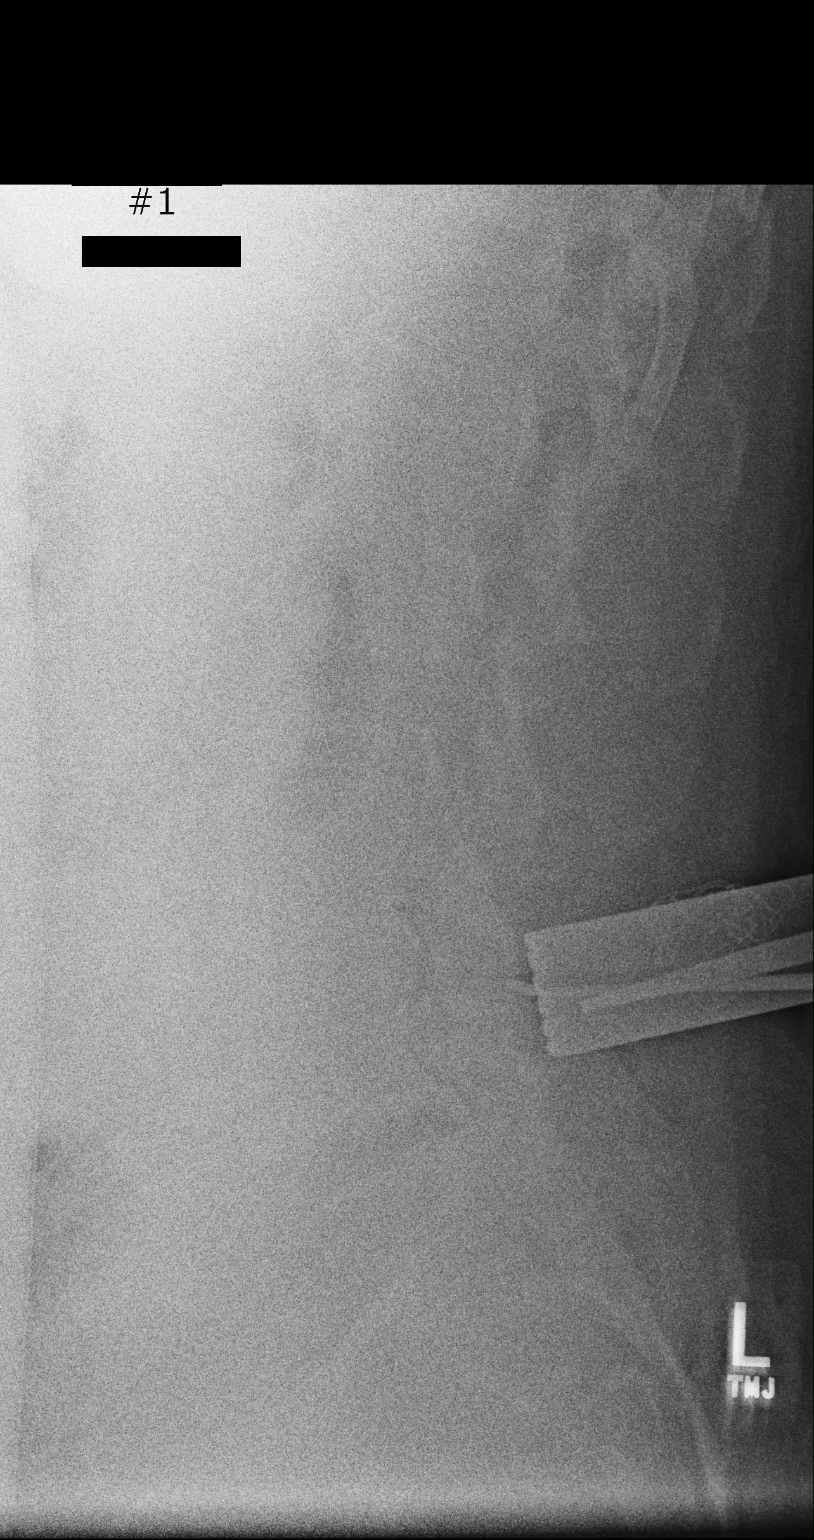

[1 of 1 positions shown; findings below may reference images not displayed]

FINDINGS: PE study is somewhat limited due to technique and body habitus.
Posterior surgical instruments appear to be directed at the L4-5
level.
IMPRESSION: Limited visualization of the lumbar spine. Posterior instruments
appear to be at the L4-5 level.

## 2016-07-19 ENCOUNTER — Other Ambulatory Visit: Payer: Self-pay | Admitting: Orthopedic Surgery

## 2016-08-10 NOTE — Pre-Procedure Instructions (Signed)
CARMERON HEADY  08/10/2016      [facility] - [address], [place] - [address], [id number] [place] [address] Phone: [phone] Fax: [phone]    Your procedure is scheduled on Aug 2  Report to [facility] Admitting at 830 A.M.  Call this number if you have problems the morning of surgery:  [phone]   Remember:  Do not eat food or drink liquids after midnight.  Take these medicines the morning of surgery with A SIP OF WATER Atenolol (tenormin), Hydrocodone (Norco), Omeprazole (Prilosec), Sucralfate (Carafate)  Stop taking aspirin, BC's, Goody's, Herbal medications, Fish Oil, Aleve, Ibuprofen, Advil, Motrin    How to Manage Your Diabetes Before and After Surgery  Why is it important to control my blood sugar before and after surgery? . Improving blood sugar levels before and after surgery helps healing and can limit problems. . A way of improving blood sugar control is eating a healthy diet by: o  Eating less sugar and carbohydrates o  Increasing activity/exercise o  Talking with your doctor about reaching your blood sugar goals . High blood sugars (greater than 180 mg/dL) can raise your risk of infections and slow your recovery, so you will need to focus on controlling your diabetes during the weeks before surgery. . Make sure that the doctor who takes care of your diabetes knows about your planned surgery including the date and location.  How do I manage my blood sugar before surgery? . Check your blood sugar at least 4 times a day, starting 2 days before surgery, to make sure that the level is not too high or low. o Check your blood sugar the morning of your surgery when you wake up and every 2 hours until you get to the Short Stay unit. . If your blood sugar is less than 70 mg/dL, you will need to treat for low blood sugar: o Do not take insulin. o Treat a low blood sugar (less than 70 mg/dL) with  cup of  clear juice (cranberry or apple), 4 glucose tablets, OR glucose gel. o Recheck blood sugar in 15 minutes after treatment (to make sure it is greater than 70 mg/dL). If your blood sugar is not greater than 70 mg/dL on recheck, call [phone] for further instructions. . Report your blood sugar to the short stay nurse when you get to Short Stay.  . If you are admitted to the hospital after surgery: o Your blood sugar will be checked by the staff and you will probably be given insulin after surgery (instead of oral diabetes medicines) to make sure you have good blood sugar levels. o The goal for blood sugar control after surgery is 80-180 mg/dL.              WHAT DO I DO ABOUT MY DIABETES MEDICATION?   Marland Kitchen Do not take oral diabetes medicines (pills) the morning of surgery. Glimepiride (Amaryl) and metformin (Glucophage)      . The day of surgery, do not take other diabetes injectables, including Byetta (exenatide), Bydureon (exenatide ER), Victoza (liraglutide), or Trulicity (dulaglutide).  . If your CBG is greater than 220 mg/dL, you may take  of your sliding scale (correction) dose of insulin.  Other Instructions:          Patient Signature:  Date:   Nurse Signature:  Date:   Reviewed and Endorsed by [facility] Patient Education Committee, August 2015  Do not  wear jewelry, make-up or nail polish.  Do not wear lotions, powders, or perfumes, or deoderant.  Do not shave 48 hours prior to surgery.  Men may shave face and neck.  Do not bring valuables to the hospital.  [facility] is not responsible for any belongings or valuables.  Contacts, dentures or bridgework may not be worn into surgery.  Leave your suitcase in the car.  After surgery it may be brought to your room.  For patients admitted to the hospital, discharge time will be determined by your treatment team.  Patients discharged the day of surgery will not be allowed to drive home.   Special instructions:   [place] - Preparing for Surgery  Before surgery, you can play an important role.  Because skin is not sterile, your skin needs to be as free of germs as possible.  You can reduce the number of germs on you skin by washing with CHG (chlorahexidine gluconate) soap before surgery.  CHG is an antiseptic cleaner which kills germs and bonds with the skin to continue killing germs even after washing.  Please DO NOT use if you have an allergy to CHG or antibacterial soaps.  If your skin becomes reddened/irritated stop using the CHG and inform your nurse when you arrive at Short Stay.  Do not shave (including legs and underarms) for at least 48 hours prior to the first CHG shower.  You may shave your face.  Please follow these instructions carefully:   1.  Shower with CHG Soap the night before surgery and the                                morning of Surgery.  2.  If you choose to wash your hair, wash your hair first as usual with your       normal shampoo.  3.  After you shampoo, rinse your hair and body thoroughly to remove the                      Shampoo.  4.  Use CHG as you would any other liquid soap.  You can apply chg directly       to the skin and wash gently with scrungie or a clean washcloth.  5.  Apply the CHG Soap to your body ONLY FROM THE NECK DOWN.        Do not use on open wounds or open sores.  Avoid contact with your eyes,       ears, mouth and genitals (private parts).  Wash genitals (private parts)       with your normal soap.  6.  Wash thoroughly, paying special attention to the area where your surgery        will be performed.  7.  Thoroughly rinse your body with warm water from the neck down.  8.  DO NOT shower/wash with your normal soap after using and rinsing off       the CHG Soap.  9.  Pat yourself dry with a clean towel.            10.  Wear clean pajamas.            11.  Place clean sheets on your bed the night of your first shower and do not        sleep with  pets.  Day of Surgery  Do not apply  any lotions/deoderants the morning of surgery.  Please wear clean clothes to the hospital/surgery center.     Please read over the following fact sheets that you were given. Pain Booklet, Coughing and Deep Breathing, MRSA Information and Surgical Site Infection Prevention, Incentive Spirometry

## 2016-08-13 ENCOUNTER — Ambulatory Visit (HOSPITAL_COMMUNITY): Admission: RE | Admit: 2016-08-13 | Payer: Worker's Compensation | Source: Ambulatory Visit

## 2016-08-13 ENCOUNTER — Encounter (HOSPITAL_COMMUNITY)
Admission: RE | Admit: 2016-08-13 | Discharge: 2016-08-13 | Disposition: A | Discharge status: Home / Self Care | Payer: Worker's Compensation | Source: Ambulatory Visit | Attending: Orthopedic Surgery | Admitting: Orthopedic Surgery

## 2016-08-13 ENCOUNTER — Encounter (HOSPITAL_COMMUNITY): Payer: Self-pay

## 2016-08-13 ENCOUNTER — Encounter (HOSPITAL_COMMUNITY)
Admission: RE | Admit: 2016-08-13 | Discharge: 2016-08-13 | Disposition: A | Discharge status: Home / Self Care | Payer: Worker's Compensation | Source: Ambulatory Visit | Attending: Obstetrics & Gynecology | Admitting: Obstetrics & Gynecology

## 2016-08-13 DIAGNOSIS — D696 Thrombocytopenia, unspecified: Secondary | ICD-10-CM | POA: Insufficient documentation

## 2016-08-13 DIAGNOSIS — N200 Calculus of kidney: Secondary | ICD-10-CM | POA: Insufficient documentation

## 2016-08-13 DIAGNOSIS — Z01812 Encounter for preprocedural laboratory examination: Secondary | ICD-10-CM | POA: Insufficient documentation

## 2016-08-13 DIAGNOSIS — I10 Essential (primary) hypertension: Secondary | ICD-10-CM | POA: Insufficient documentation

## 2016-08-13 DIAGNOSIS — G4733 Obstructive sleep apnea (adult) (pediatric): Secondary | ICD-10-CM | POA: Insufficient documentation

## 2016-08-13 DIAGNOSIS — K219 Gastro-esophageal reflux disease without esophagitis: Secondary | ICD-10-CM | POA: Insufficient documentation

## 2016-08-13 DIAGNOSIS — Z01810 Encounter for preprocedural cardiovascular examination: Secondary | ICD-10-CM | POA: Insufficient documentation

## 2016-08-13 DIAGNOSIS — G510 Bell's palsy: Secondary | ICD-10-CM | POA: Insufficient documentation

## 2016-08-13 DIAGNOSIS — E7800 Pure hypercholesterolemia, unspecified: Secondary | ICD-10-CM | POA: Insufficient documentation

## 2016-08-13 DIAGNOSIS — Z01818 Encounter for other preprocedural examination: Secondary | ICD-10-CM | POA: Insufficient documentation

## 2016-08-13 DIAGNOSIS — E119 Type 2 diabetes mellitus without complications: Secondary | ICD-10-CM | POA: Insufficient documentation

## 2016-08-13 HISTORY — DX: Thrombocytopenia, unspecified: D69.6

## 2016-08-13 HISTORY — DX: Bell's palsy: G51.0

## 2016-08-13 HISTORY — DX: Headache: R51

## 2016-08-13 HISTORY — DX: Headache, unspecified: R51.9

## 2016-08-13 HISTORY — DX: Presence of spectacles and contact lenses: Z97.3

## 2016-08-13 LAB — COMPREHENSIVE METABOLIC PANEL
ALT: 47 U/L (ref 17–63)
ANION GAP: 9 (ref 5–15)
AST: 40 U/L (ref 15–41)
Albumin: 4 g/dL (ref 3.5–5.0)
Alkaline Phosphatase: 70 U/L (ref 38–126)
BILIRUBIN TOTAL: 0.9 mg/dL (ref 0.3–1.2)
BUN: 19 mg/dL (ref 6–20)
CO2: 21 mmol/L — ABNORMAL LOW (ref 22–32)
Calcium: 9.9 mg/dL (ref 8.9–10.3)
Chloride: 108 mmol/L (ref 101–111)
Creatinine, Ser: 1.07 mg/dL (ref 0.61–1.24)
Glucose, Bld: 95 mg/dL (ref 65–99)
POTASSIUM: 3.7 mmol/L (ref 3.5–5.1)
Sodium: 138 mmol/L (ref 135–145)
TOTAL PROTEIN: 8.1 g/dL (ref 6.5–8.1)

## 2016-08-13 LAB — URINALYSIS, ROUTINE W REFLEX MICROSCOPIC
BACTERIA UA: NONE SEEN
BILIRUBIN URINE: NEGATIVE
Glucose, UA: NEGATIVE mg/dL
Ketones, ur: NEGATIVE mg/dL
NITRITE: NEGATIVE
PROTEIN: NEGATIVE mg/dL
Specific Gravity, Urine: 1.014 (ref 1.005–1.030)
pH: 7 (ref 5.0–8.0)

## 2016-08-13 LAB — PROTIME-INR
INR: 1.11
PROTHROMBIN TIME: 14.4 s (ref 11.4–15.2)

## 2016-08-13 LAB — CBC WITH DIFFERENTIAL/PLATELET
BASOS PCT: 1 %
Basophils Absolute: 0.1 10*3/uL (ref 0.0–0.1)
EOS ABS: 1.1 10*3/uL — AB (ref 0.0–0.7)
Eosinophils Relative: 13 %
HEMATOCRIT: 45.6 % (ref 39.0–52.0)
Hemoglobin: 14.9 g/dL (ref 13.0–17.0)
LYMPHS ABS: 1.4 10*3/uL (ref 0.7–4.0)
Lymphocytes Relative: 16 %
MCH: 26.8 pg (ref 26.0–34.0)
MCHC: 32.7 g/dL (ref 30.0–36.0)
MCV: 81.9 fL (ref 78.0–100.0)
Monocytes Absolute: 0.4 10*3/uL (ref 0.1–1.0)
Monocytes Relative: 5 %
NEUTROS PCT: 65 %
Neutro Abs: 5.4 10*3/uL (ref 1.7–7.7)
PLATELETS: 88 10*3/uL — AB (ref 150–400)
RBC: 5.57 MIL/uL (ref 4.22–5.81)
RDW: 15.5 % (ref 11.5–15.5)
WBC: 8.3 10*3/uL (ref 4.0–10.5)

## 2016-08-13 LAB — APTT: APTT: 33 s (ref 24–36)

## 2016-08-13 LAB — SURGICAL PCR SCREEN
MRSA, PCR: NEGATIVE
STAPHYLOCOCCUS AUREUS: NEGATIVE

## 2016-08-13 LAB — TYPE AND SCREEN
ABO/RH(D): A POS
ANTIBODY SCREEN: NEGATIVE

## 2016-08-13 LAB — ABO/RH: ABO/RH(D): A POS

## 2016-08-13 LAB — GLUCOSE, CAPILLARY: Glucose-Capillary: 85 mg/dL (ref 65–99)

## 2016-08-13 NOTE — Progress Notes (Signed)
Pt denies SOB, chest pain, and being under the care of a cardiologist. Pt denies having a cardiac cath but stated that a stress test was performed in [facility] at [facility] (results not seen in Care Everywhere so, records requested). [facility] responded to request " no treatment at this facility for the dates of service you requested." Pt denies having a chest x ray and EKG within the last year. Pt denies having recent labs. Pt PCP is Dr. Carollee MassedAnthony Alexander. Pt to bring remote for spinal cord stimulator on DOS. Left voice message with Albin Fellingarla, Surgical Coordinator, to have MD clarify order for consent. Pt chart forwarded to anesthesia for review (history of Afib 2012, spinal cord stimulator, DM, OSA, HTN, hypercholesterolemia).

## 2016-08-13 NOTE — Progress Notes (Signed)
Pt platelets 88; chart forwarded to anesthesia for review.

## 2016-08-14 ENCOUNTER — Encounter (HOSPITAL_COMMUNITY): Payer: Self-pay

## 2016-08-14 LAB — HEMOGLOBIN A1C
Hgb A1c MFr Bld: 6 % — ABNORMAL HIGH (ref 4.8–5.6)
MEAN PLASMA GLUCOSE: 126 mg/dL

## 2016-08-14 NOTE — Progress Notes (Addendum)
Anesthesia Chart Review:  Patient is a 61 year old male scheduled for left total shoulder arthroplasty on 08/16/16 by Dr. Ave Filterhandler.  History includes never-smoker, HTN, hypercholesterolemia, PAF (single episode ~ '11), OSA (with CPAP use), DM2, thrombocytopenia, GERD, nephrolithiasis, Bell's Palsy (left) 06/2015, T&A, spinal cord stimulator, L4-5 laminectomy/microdiskect34omy '15, redo L4-5 microdiscectomy 06/18/14. BMI is consistent with morbid obesity.   PCP is Dr. Venetia ConstableHarry Anthony [facility] with [facility] in [place] [phone]). Most recent records requested, but PCP note from 02/10/14 also mention a history of fatty liver and thrombocytopenia. He is no longer followed by cardiology but was previously evaluated in 2011 for PAF by Dr. Lorelei PontAsif Wahid with [facility] (scanned under Media tab, Correspondence).  Meds include atenolol, Amaryl, Norco, losartan, metformin, Prilosec, Carafate, pravastatin, Maxzide.  BP 116/69   Pulse 67   Temp 36.6 C   Resp 20   Ht 5\' 11"  (1.803 m)   Wt (!) 327 lb 1.6 oz (148.4 kg)   SpO2 98%   BMI 45.62 kg/m   EKG 08/13/16: NSR.   Echo 06/20/15 [facility]; Care Everywhere; done during possible CVA w/u-->dx with Bell's Palsy): Summary Normal overall LV systolic function. Estimated LV ejection fraction 60%. No regional wall motion abnormalities were seen, but a complete assessment could not be obtained due to difficult image quality LV diastolic function is normal. Mild left ventricular hypertrophy. No significant valvular stenosis or regurgitation.  06/09/09 Dobutamine stress echo [facility]; scanned under Media tab, Correspondence 06/18/14): Normal resting wall motion and no stress-induced wall motion abnormality. Normal LVF at rest. No wall motion abnormalities noted at adequate work load.  Carotid U/S 06/20/15 [facility]; Care Everywhere; done during possible CVA w/u-->dx with Bell's  Palsy): Summary  1. RIGHT: There is atheroma seen in the carotid arteries with mild-moderate  internal carotid stenosis, 40-59%  2. LEFT: There are elevated velocities in the common carotid artery in an  area not well seen by US. Consider CTA to exclude ostial stenosis. There is  atheroma in the internal carotid artery with 40-59% stenosis.  3. Vertebral flow is antegrade and normal bilaterally.  4. Subclavian flow is multiphasic, but velocities are elevated bilaterally,  consistent with stenosis. Given the antegrade vertebral flow, it is unlikely  to be critical.  MRI Brain 06/20/15 [facility]; Care Everywhere; done during possible CVA w/u-->dx with Bell's Palsy): IMPRESSION: - No acute infarct or intracranial hemorrhage. - Enlarged pituitary gland. Dedicated contrast enhanced pituitary MR can be obtained for further delineation. - T1 hyperintense basal ganglia possibly normal although can be seen in this setting of liver disease.  CXR 08/13/16: FINDINGS: - The cardiomediastinal silhouette is unremarkable. - Mild chronic peribronchial thickening/ interstitial prominence is unchanged. - There is no evidence of focal airspace disease, pulmonary edema, suspicious pulmonary nodule/mass, pleural effusion, or pneumothorax. No acute bony abnormalities are identified. - A thoracic cord stimulator is noted. IMPRESSION: No evidence of acute cardiopulmonary disease.  Preoperative labs noted. Cr 1.07. AST/ALT 40/47. H/H 14.9/45.6. PLT 88K (previously 81 K 03/06/16 at [facility], 107K 06/09/14 and 112 on 05/07/13). A1c 6.0. T&S done. (I have left a voice message with Albin FellingCarla at Dr. Veda Canninghandler's office regarding PLT results.)  There is no mention of 06/2015 carotid or pituitary MRI findings on 06/2015 [facility] discharge summary (so I'm unsure if Dr. Lyn HollingsheadAlexander is aware). Last seen by Dr. Lyn HollingsheadAlexander in 02/2016 for CPE, but note is still pending. Patient says he is due to 6 months follow-up after  surgery (schedueld for August). He denied chest pain, SOB, syncope, palpitations, recurrent stroke-like symptoms (since Bell's Palsy admission). He doesn't recall many details about his thrombocytopenia history, but it doesn't appear new. We discussed his carotid U/S results and that he will likely need on-going future follow-up to evaluate for progressive stenosis (would not have to be done prior to surgery). I also advised that I would forward my note (with summary of 06/2015 findings), so Dr. Lyn HollingsheadAlexander can discuss future follow-up as felt indicated at his post-operative visit.   Reviewed above with anesthesiologist Dr. Sandford Craze. [place]. If patient without recurrent CVA-type symptoms, repeat platelet count on the day of surgery stable, and otherwise no acute changes then it is anticipated that he can proceed as planned. (Update 08/14/16 3:59 PM: Confirmed with Rhonda at Dr. Mardelle MatteAlexander's office that they had received my fax. Advised that she have Dr. Lyn HollingsheadAlexander review for follow-up recommendations. I highlighted areas that needed his attention. However, afterwards, I received patient's 02/20/16 note that does indicate that Dr. Lyn HollingsheadAlexander is already aware of previous carotid and brain MRI findings.)  Velna Ochsllison Raniyah Curenton, PA-C [facility]/Anesthesiology Phone [phone] 08/14/2016 3:34 PM

## 2016-08-15 MED ORDER — DEXTROSE 5 % IV SOLN
3.0000 g | INTRAVENOUS | Status: AC
  Administered 2016-08-16: 3 g via INTRAVENOUS
  Filled 2016-08-15: qty 3000

## 2016-08-16 ENCOUNTER — Inpatient Hospital Stay (HOSPITAL_COMMUNITY): Payer: Worker's Compensation

## 2016-08-16 ENCOUNTER — Inpatient Hospital Stay (HOSPITAL_COMMUNITY): Payer: Worker's Compensation | Admitting: Vascular Surgery

## 2016-08-16 ENCOUNTER — Encounter (HOSPITAL_COMMUNITY)
Admission: RE | Disposition: A | Discharge status: Home / Self Care | Payer: Self-pay | Source: Ambulatory Visit | Attending: Orthopedic Surgery

## 2016-08-16 ENCOUNTER — Inpatient Hospital Stay (HOSPITAL_COMMUNITY)
Admission: RE | Admit: 2016-08-16 | Discharge: 2016-08-17 | DRG: 483 | Disposition: A | Discharge status: Home / Self Care | Payer: Worker's Compensation | Source: Ambulatory Visit | Attending: Orthopedic Surgery | Admitting: Orthopedic Surgery

## 2016-08-16 DIAGNOSIS — I4891 Unspecified atrial fibrillation: Secondary | ICD-10-CM | POA: Diagnosis present

## 2016-08-16 DIAGNOSIS — Z79899 Other long term (current) drug therapy: Secondary | ICD-10-CM | POA: Diagnosis not present

## 2016-08-16 DIAGNOSIS — E7800 Pure hypercholesterolemia, unspecified: Secondary | ICD-10-CM | POA: Diagnosis present

## 2016-08-16 DIAGNOSIS — Z833 Family history of diabetes mellitus: Secondary | ICD-10-CM

## 2016-08-16 DIAGNOSIS — I10 Essential (primary) hypertension: Secondary | ICD-10-CM | POA: Diagnosis present

## 2016-08-16 DIAGNOSIS — M549 Dorsalgia, unspecified: Secondary | ICD-10-CM | POA: Diagnosis present

## 2016-08-16 DIAGNOSIS — Z6841 Body Mass Index (BMI) 40.0 and over, adult: Secondary | ICD-10-CM | POA: Diagnosis not present

## 2016-08-16 DIAGNOSIS — Z7984 Long term (current) use of oral hypoglycemic drugs: Secondary | ICD-10-CM

## 2016-08-16 DIAGNOSIS — G4730 Sleep apnea, unspecified: Secondary | ICD-10-CM | POA: Diagnosis present

## 2016-08-16 DIAGNOSIS — G8929 Other chronic pain: Secondary | ICD-10-CM | POA: Diagnosis present

## 2016-08-16 DIAGNOSIS — E119 Type 2 diabetes mellitus without complications: Secondary | ICD-10-CM | POA: Diagnosis present

## 2016-08-16 DIAGNOSIS — M19012 Primary osteoarthritis, left shoulder: Principal | ICD-10-CM | POA: Diagnosis present

## 2016-08-16 DIAGNOSIS — K219 Gastro-esophageal reflux disease without esophagitis: Secondary | ICD-10-CM | POA: Diagnosis present

## 2016-08-16 DIAGNOSIS — Z96612 Presence of left artificial shoulder joint: Secondary | ICD-10-CM

## 2016-08-16 HISTORY — PX: TOTAL SHOULDER ARTHROPLASTY: SHX126

## 2016-08-16 LAB — CBC
HCT: 44.9 % (ref 39.0–52.0)
HEMOGLOBIN: 15.1 g/dL (ref 13.0–17.0)
MCH: 27.3 pg (ref 26.0–34.0)
MCHC: 33.6 g/dL (ref 30.0–36.0)
MCV: 81 fL (ref 78.0–100.0)
Platelets: 104 10*3/uL — ABNORMAL LOW (ref 150–400)
RBC: 5.54 MIL/uL (ref 4.22–5.81)
RDW: 15.7 % — ABNORMAL HIGH (ref 11.5–15.5)
WBC: 8.1 10*3/uL (ref 4.0–10.5)

## 2016-08-16 LAB — GLUCOSE, CAPILLARY
GLUCOSE-CAPILLARY: 120 mg/dL — AB (ref 65–99)
GLUCOSE-CAPILLARY: 107 mg/dL — AB (ref 65–99)
GLUCOSE-CAPILLARY: 143 mg/dL — AB (ref 65–99)
GLUCOSE-CAPILLARY: 112 mg/dL — AB (ref 65–99)

## 2016-08-16 SURGERY — ARTHROPLASTY, SHOULDER, TOTAL
Anesthesia: General | Laterality: Left

## 2016-08-16 MED ORDER — SUCCINYLCHOLINE CHLORIDE 200 MG/10ML IV SOSY
PREFILLED_SYRINGE | INTRAVENOUS | Status: AC
  Filled 2016-08-16: qty 10

## 2016-08-16 MED ORDER — 0.9 % SODIUM CHLORIDE (POUR BTL) OPTIME
TOPICAL | Status: DC | PRN
  Administered 2016-08-16: 1000 mL

## 2016-08-16 MED ORDER — INSULIN ASPART 100 UNIT/ML ~~LOC~~ SOLN: 0.0000 [IU] | Freq: Three times a day (TID) | SUBCUTANEOUS | Status: DC

## 2016-08-16 MED ORDER — METOCLOPRAMIDE HCL 5 MG PO TABS: 5.0000 mg | ORAL_TABLET | Freq: Three times a day (TID) | ORAL | Status: DC | PRN

## 2016-08-16 MED ORDER — SODIUM CHLORIDE 0.9 % IV SOLN
INTRAVENOUS | Status: DC
  Administered 2016-08-16 – 2016-08-17 (×2): via INTRAVENOUS

## 2016-08-16 MED ORDER — MIDAZOLAM HCL 2 MG/2ML IJ SOLN
INTRAMUSCULAR | Status: AC
  Filled 2016-08-16: qty 2

## 2016-08-16 MED ORDER — MIDAZOLAM HCL 2 MG/2ML IJ SOLN
INTRAMUSCULAR | Status: AC
  Administered 2016-08-16: 2 mg
  Filled 2016-08-16: qty 2

## 2016-08-16 MED ORDER — MORPHINE SULFATE (PF) 4 MG/ML IV SOLN
1.0000 mg | INTRAVENOUS | Status: DC | PRN
  Administered 2016-08-17: 2 mg via INTRAVENOUS
  Filled 2016-08-16: qty 1

## 2016-08-16 MED ORDER — ONDANSETRON HCL 4 MG/2ML IJ SOLN
INTRAMUSCULAR | Status: AC
  Filled 2016-08-16: qty 2

## 2016-08-16 MED ORDER — ONDANSETRON HCL 4 MG/2ML IJ SOLN: 4.0000 mg | Freq: Four times a day (QID) | INTRAMUSCULAR | Status: DC | PRN

## 2016-08-16 MED ORDER — ROCURONIUM BROMIDE 100 MG/10ML IV SOLN
INTRAVENOUS | Status: DC | PRN
  Administered 2016-08-16: 20 mg via INTRAVENOUS
  Administered 2016-08-16: 60 mg via INTRAVENOUS
  Administered 2016-08-16 (×2): 10 mg via INTRAVENOUS

## 2016-08-16 MED ORDER — PRAVASTATIN SODIUM 40 MG PO TABS
40.0000 mg | ORAL_TABLET | Freq: Every evening | ORAL | Status: DC
  Administered 2016-08-16: 40 mg via ORAL
  Filled 2016-08-16: qty 1

## 2016-08-16 MED ORDER — ASPIRIN EC 325 MG PO TBEC
325.0000 mg | DELAYED_RELEASE_TABLET | Freq: Two times a day (BID) | ORAL | Status: DC
  Administered 2016-08-16 – 2016-08-17 (×2): 325 mg via ORAL
  Filled 2016-08-16 (×2): qty 1

## 2016-08-16 MED ORDER — SUCRALFATE 1 G PO TABS
2.0000 g | ORAL_TABLET | Freq: Two times a day (BID) | ORAL | Status: DC
  Administered 2016-08-16 – 2016-08-17 (×2): 2 g via ORAL
  Filled 2016-08-16 (×2): qty 2

## 2016-08-16 MED ORDER — PHENYLEPHRINE HCL 10 MG/ML IJ SOLN
INTRAVENOUS | Status: DC | PRN
  Administered 2016-08-16: 20 ug/min via INTRAVENOUS

## 2016-08-16 MED ORDER — MIDAZOLAM HCL 5 MG/5ML IJ SOLN
INTRAMUSCULAR | Status: DC | PRN
  Administered 2016-08-16: 1 mg via INTRAVENOUS

## 2016-08-16 MED ORDER — ONDANSETRON HCL 4 MG/2ML IJ SOLN
INTRAMUSCULAR | Status: DC | PRN
  Administered 2016-08-16: 4 mg via INTRAVENOUS

## 2016-08-16 MED ORDER — ALUM & MAG HYDROXIDE-SIMETH 200-200-20 MG/5ML PO SUSP: 30.0000 mL | ORAL | Status: DC | PRN

## 2016-08-16 MED ORDER — SODIUM CHLORIDE 0.9% FLUSH
INTRAVENOUS | Status: DC | PRN
  Administered 2016-08-16: 40 mL

## 2016-08-16 MED ORDER — TRANEXAMIC ACID 1000 MG/10ML IV SOLN
1000.0000 mg | INTRAVENOUS | Status: AC
  Administered 2016-08-16: 1000 mg via INTRAVENOUS
  Filled 2016-08-16: qty 10

## 2016-08-16 MED ORDER — FLEET ENEMA 7-19 GM/118ML RE ENEM: 1.0000 | ENEMA | Freq: Once | RECTAL | Status: DC | PRN

## 2016-08-16 MED ORDER — BISACODYL 5 MG PO TBEC: 5.0000 mg | DELAYED_RELEASE_TABLET | Freq: Every day | ORAL | Status: DC | PRN

## 2016-08-16 MED ORDER — SUGAMMADEX SODIUM 500 MG/5ML IV SOLN
INTRAVENOUS | Status: DC | PRN
  Administered 2016-08-16: 300 mg via INTRAVENOUS

## 2016-08-16 MED ORDER — MENTHOL 3 MG MT LOZG
1.0000 | LOZENGE | OROMUCOSAL | Status: DC | PRN
Start: 2016-08-16 — End: 2016-08-17

## 2016-08-16 MED ORDER — OXYCODONE HCL 5 MG PO TABS
ORAL_TABLET | ORAL | Status: AC
  Filled 2016-08-16: qty 2

## 2016-08-16 MED ORDER — FENTANYL CITRATE (PF) 100 MCG/2ML IJ SOLN
INTRAMUSCULAR | Status: AC
  Administered 2016-08-16: 100 ug
  Filled 2016-08-16: qty 2

## 2016-08-16 MED ORDER — METOCLOPRAMIDE HCL 5 MG/ML IJ SOLN: 5.0000 mg | Freq: Three times a day (TID) | INTRAMUSCULAR | Status: DC | PRN

## 2016-08-16 MED ORDER — ACETAMINOPHEN 325 MG PO TABS: 650.0000 mg | ORAL_TABLET | Freq: Four times a day (QID) | ORAL | Status: DC | PRN

## 2016-08-16 MED ORDER — GLIMEPIRIDE 1 MG PO TABS
1.0000 mg | ORAL_TABLET | Freq: Every day | ORAL | Status: DC
  Administered 2016-08-17: 1 mg via ORAL
  Filled 2016-08-16: qty 1

## 2016-08-16 MED ORDER — FENTANYL CITRATE (PF) 250 MCG/5ML IJ SOLN
INTRAMUSCULAR | Status: DC | PRN
  Administered 2016-08-16: 50 ug via INTRAVENOUS
  Administered 2016-08-16 (×2): 100 ug via INTRAVENOUS

## 2016-08-16 MED ORDER — ONDANSETRON HCL 4 MG PO TABS: 4.0000 mg | ORAL_TABLET | Freq: Four times a day (QID) | ORAL | Status: DC | PRN

## 2016-08-16 MED ORDER — FENTANYL CITRATE (PF) 250 MCG/5ML IJ SOLN
INTRAMUSCULAR | Status: AC
  Filled 2016-08-16: qty 5

## 2016-08-16 MED ORDER — LIDOCAINE HCL (CARDIAC) 20 MG/ML IV SOLN
INTRAVENOUS | Status: DC | PRN
  Administered 2016-08-16: 50 mg via INTRATRACHEAL

## 2016-08-16 MED ORDER — PROPOFOL 10 MG/ML IV BOLUS
INTRAVENOUS | Status: AC
  Filled 2016-08-16: qty 20

## 2016-08-16 MED ORDER — POLYETHYLENE GLYCOL 3350 17 G PO PACK: 17.0000 g | PACK | Freq: Every day | ORAL | Status: DC | PRN

## 2016-08-16 MED ORDER — PROPOFOL 10 MG/ML IV BOLUS
INTRAVENOUS | Status: DC | PRN
  Administered 2016-08-16: 200 mg via INTRAVENOUS

## 2016-08-16 MED ORDER — BUPIVACAINE LIPOSOME 1.3 % IJ SUSP
20.0000 mL | INTRAMUSCULAR | Status: DC
  Filled 2016-08-16: qty 20

## 2016-08-16 MED ORDER — PANTOPRAZOLE SODIUM 40 MG PO TBEC
40.0000 mg | DELAYED_RELEASE_TABLET | Freq: Every day | ORAL | Status: DC
  Administered 2016-08-16 – 2016-08-17 (×2): 40 mg via ORAL
  Filled 2016-08-16 (×2): qty 1

## 2016-08-16 MED ORDER — LOSARTAN POTASSIUM 50 MG PO TABS
50.0000 mg | ORAL_TABLET | Freq: Every day | ORAL | Status: DC
  Administered 2016-08-16 – 2016-08-17 (×2): 50 mg via ORAL
  Filled 2016-08-16 (×2): qty 1

## 2016-08-16 MED ORDER — ZOLPIDEM TARTRATE 5 MG PO TABS: 5.0000 mg | ORAL_TABLET | Freq: Every evening | ORAL | Status: DC | PRN

## 2016-08-16 MED ORDER — SODIUM CHLORIDE 0.9 % IR SOLN
Status: DC | PRN
  Administered 2016-08-16: 3000 mL

## 2016-08-16 MED ORDER — POVIDONE-IODINE 7.5 % EX SOLN: Freq: Once | CUTANEOUS | Status: DC

## 2016-08-16 MED ORDER — ATENOLOL 50 MG PO TABS
50.0000 mg | ORAL_TABLET | Freq: Every day | ORAL | Status: DC
  Administered 2016-08-17: 50 mg via ORAL
  Filled 2016-08-16 (×2): qty 1

## 2016-08-16 MED ORDER — DOCUSATE SODIUM 100 MG PO CAPS
100.0000 mg | ORAL_CAPSULE | Freq: Two times a day (BID) | ORAL | Status: DC
  Administered 2016-08-16 – 2016-08-17 (×2): 100 mg via ORAL
  Filled 2016-08-16 (×2): qty 1

## 2016-08-16 MED ORDER — LIDOCAINE 2% (20 MG/ML) 5 ML SYRINGE
INTRAMUSCULAR | Status: AC
  Filled 2016-08-16: qty 10

## 2016-08-16 MED ORDER — DIPHENHYDRAMINE HCL 12.5 MG/5ML PO ELIX: 12.5000 mg | ORAL_SOLUTION | ORAL | Status: DC | PRN

## 2016-08-16 MED ORDER — BUPIVACAINE LIPOSOME 1.3 % IJ SUSP
INTRAMUSCULAR | Status: DC | PRN
  Administered 2016-08-16: 20 mL

## 2016-08-16 MED ORDER — BUPIVACAINE-EPINEPHRINE (PF) 0.5% -1:200000 IJ SOLN
INTRAMUSCULAR | Status: DC | PRN
  Administered 2016-08-16: 30 mL

## 2016-08-16 MED ORDER — SUGAMMADEX SODIUM 200 MG/2ML IV SOLN
INTRAVENOUS | Status: AC
  Filled 2016-08-16: qty 2

## 2016-08-16 MED ORDER — ACETAMINOPHEN 650 MG RE SUPP: 650.0000 mg | Freq: Four times a day (QID) | RECTAL | Status: DC | PRN

## 2016-08-16 MED ORDER — CEFAZOLIN SODIUM-DEXTROSE 2-4 GM/100ML-% IV SOLN
2.0000 g | Freq: Four times a day (QID) | INTRAVENOUS | Status: AC
  Administered 2016-08-16 – 2016-08-17 (×3): 2 g via INTRAVENOUS
  Filled 2016-08-16 (×3): qty 100

## 2016-08-16 MED ORDER — METFORMIN HCL 500 MG PO TABS
500.0000 mg | ORAL_TABLET | Freq: Two times a day (BID) | ORAL | Status: DC
  Administered 2016-08-16 – 2016-08-17 (×2): 500 mg via ORAL
  Filled 2016-08-16 (×2): qty 1

## 2016-08-16 MED ORDER — SUCCINYLCHOLINE CHLORIDE 20 MG/ML IJ SOLN
INTRAMUSCULAR | Status: DC | PRN
  Administered 2016-08-16: 200 mg via INTRAVENOUS

## 2016-08-16 MED ORDER — LACTATED RINGERS IV SOLN
INTRAVENOUS | Status: DC
  Administered 2016-08-16 (×2): via INTRAVENOUS

## 2016-08-16 MED ORDER — ACETAMINOPHEN 500 MG PO TABS
1000.0000 mg | ORAL_TABLET | Freq: Four times a day (QID) | ORAL | Status: DC
  Administered 2016-08-16 – 2016-08-17 (×3): 1000 mg via ORAL
  Filled 2016-08-16 (×3): qty 2

## 2016-08-16 MED ORDER — DEXMEDETOMIDINE HCL 200 MCG/2ML IV SOLN
INTRAVENOUS | Status: DC | PRN
  Administered 2016-08-16 (×2): 12 ug via INTRAVENOUS

## 2016-08-16 MED ORDER — ROCURONIUM BROMIDE 10 MG/ML (PF) SYRINGE
PREFILLED_SYRINGE | INTRAVENOUS | Status: AC
  Filled 2016-08-16: qty 10

## 2016-08-16 MED ORDER — OXYCODONE HCL 5 MG PO TABS
5.0000 mg | ORAL_TABLET | ORAL | Status: DC | PRN
  Administered 2016-08-16 – 2016-08-17 (×4): 10 mg via ORAL
  Filled 2016-08-16 (×3): qty 2

## 2016-08-16 MED ORDER — PHENOL 1.4 % MT LIQD: 1.0000 | OROMUCOSAL | Status: DC | PRN

## 2016-08-16 MED ORDER — TRIAMTERENE-HCTZ 37.5-25 MG PO TABS
1.0000 | ORAL_TABLET | Freq: Every day | ORAL | Status: DC
  Administered 2016-08-16 – 2016-08-17 (×2): 1 via ORAL
  Filled 2016-08-16 (×2): qty 1

## 2016-08-16 SURGICAL SUPPLY — 75 items
AUGMENT GLENOID REVISION SM RT (Shoulder) ×1 IMPLANT
BIT DRILL 5/64X5 DISP (BIT) ×3 IMPLANT
BLADE SAW SAG 73X25 THK (BLADE) ×2
BLADE SAW SGTL 73X25 THK (BLADE) ×1 IMPLANT
BLADE SURG 15 STRL LF DISP TIS (BLADE) ×1 IMPLANT
BLADE SURG 15 STRL SS (BLADE) ×2
CAP SHLDR PARTIAL 2 ×3 IMPLANT
CEMENT BONE DEPUY (Cement) ×3 IMPLANT
CHLORAPREP W/TINT 26ML (MISCELLANEOUS) ×3 IMPLANT
CLOSURE STERI-STRIP 1/2X4 (GAUZE/BANDAGES/DRESSINGS) ×1
CLOSURE WOUND 1/2 X4 (GAUZE/BANDAGES/DRESSINGS) ×1
CLSR STERI-STRIP ANTIMIC 1/2X4 (GAUZE/BANDAGES/DRESSINGS) ×2 IMPLANT
COVER SURGICAL LIGHT HANDLE (MISCELLANEOUS) ×3 IMPLANT
DRAPE INCISE IOBAN 66X45 STRL (DRAPES) ×3 IMPLANT
DRAPE ORTHO SPLIT 77X108 STRL (DRAPES) ×4
DRAPE SURG 17X23 STRL (DRAPES) ×3 IMPLANT
DRAPE SURG ORHT 6 SPLT 77X108 (DRAPES) ×2 IMPLANT
DRAPE U-SHAPE 47X51 STRL (DRAPES) ×3 IMPLANT
DRESSING AQUACEL AG SP 3.5X10 (GAUZE/BANDAGES/DRESSINGS) ×1 IMPLANT
DRSG AQUACEL AG ADV 3.5X10 (GAUZE/BANDAGES/DRESSINGS) ×3 IMPLANT
DRSG AQUACEL AG SP 3.5X10 (GAUZE/BANDAGES/DRESSINGS) ×3
ELECT BLADE 4.0 EZ CLEAN MEGAD (MISCELLANEOUS) ×3
ELECT REM PT RETURN 9FT ADLT (ELECTROSURGICAL) ×3
ELECTRODE BLDE 4.0 EZ CLN MEGD (MISCELLANEOUS) ×1 IMPLANT
ELECTRODE REM PT RTRN 9FT ADLT (ELECTROSURGICAL) ×1 IMPLANT
EVACUATOR 1/8 PVC DRAIN (DRAIN) IMPLANT
GLOVE BIO SURGEON STRL SZ7 (GLOVE) ×3 IMPLANT
GLOVE BIO SURGEON STRL SZ7.5 (GLOVE) ×3 IMPLANT
GLOVE BIOGEL PI IND STRL 7.0 (GLOVE) ×1 IMPLANT
GLOVE BIOGEL PI IND STRL 8 (GLOVE) ×1 IMPLANT
GLOVE BIOGEL PI INDICATOR 7.0 (GLOVE) ×2
GLOVE BIOGEL PI INDICATOR 8 (GLOVE) ×2
GOWN STRL REUS W/ TWL LRG LVL3 (GOWN DISPOSABLE) ×1 IMPLANT
GOWN STRL REUS W/ TWL XL LVL3 (GOWN DISPOSABLE) ×1 IMPLANT
GOWN STRL REUS W/TWL LRG LVL3 (GOWN DISPOSABLE) ×2
GOWN STRL REUS W/TWL XL LVL3 (GOWN DISPOSABLE) ×2
GUIDEWIRE GLENOID 2.5X220 (WIRE) ×3 IMPLANT
HANDPIECE INTERPULSE COAX TIP (DISPOSABLE) ×2
HEMOSTAT SURGICEL 2X14 (HEMOSTASIS) ×3 IMPLANT
HOOD PEEL AWAY FLYTE STAYCOOL (MISCELLANEOUS) ×6 IMPLANT
KIT BASIN OR (CUSTOM PROCEDURE TRAY) ×3 IMPLANT
KIT ROOM TURNOVER OR (KITS) ×3 IMPLANT
MANIFOLD NEPTUNE II (INSTRUMENTS) ×3 IMPLANT
NEEDLE HYPO 25GX1X1/2 BEV (NEEDLE) ×3 IMPLANT
NEEDLE MAYO TROCAR (NEEDLE) ×6 IMPLANT
NEEDLE SPNL 18GX3.5 QUINCKE PK (NEEDLE) ×6 IMPLANT
NS IRRIG 1000ML POUR BTL (IV SOLUTION) ×3 IMPLANT
PACK SHOULDER (CUSTOM PROCEDURE TRAY) ×3 IMPLANT
PAD ARMBOARD 7.5X6 YLW CONV (MISCELLANEOUS) ×6 IMPLANT
RESTRAINT HEAD UNIVERSAL NS (MISCELLANEOUS) ×3 IMPLANT
RETRIEVER SUT HEWSON (MISCELLANEOUS) ×3 IMPLANT
REVISION GLENOID AUGMENT SM RT (Shoulder) ×3 IMPLANT
SET HNDPC FAN SPRY TIP SCT (DISPOSABLE) ×1 IMPLANT
SLING ARM FOAM STRAP LRG (SOFTGOODS) ×3 IMPLANT
SLING ARM FOAM STRAP MED (SOFTGOODS) IMPLANT
SMARTMIX MINI TOWER (MISCELLANEOUS) ×3
SPONGE LAP 18X18 X RAY DECT (DISPOSABLE) ×3 IMPLANT
SPONGE LAP 4X18 X RAY DECT (DISPOSABLE) IMPLANT
STRIP CLOSURE SKIN 1/2X4 (GAUZE/BANDAGES/DRESSINGS) ×2 IMPLANT
SUCTION FRAZIER HANDLE 10FR (MISCELLANEOUS) ×2
SUCTION TUBE FRAZIER 10FR DISP (MISCELLANEOUS) ×1 IMPLANT
SUPPORT WRAP ARM LG (MISCELLANEOUS) ×3 IMPLANT
SUT ETHIBOND NAB CT1 #1 30IN (SUTURE) ×9 IMPLANT
SUT MNCRL AB 4-0 PS2 18 (SUTURE) ×3 IMPLANT
SUT SILK 2 0 TIES 17X18 (SUTURE)
SUT SILK 2-0 18XBRD TIE BLK (SUTURE) IMPLANT
SUT VIC AB 2-0 CT1 27 (SUTURE) ×2
SUT VIC AB 2-0 CT1 TAPERPNT 27 (SUTURE) ×1 IMPLANT
SYR 30ML LL (SYRINGE) ×6 IMPLANT
SYR CONTROL 10ML LL (SYRINGE) ×3 IMPLANT
TAPE LABRALWHITE 1.5X36 (TAPE) ×6 IMPLANT
TAPE SUT LABRALTAP WHT/BLK (SUTURE) ×3 IMPLANT
TOWEL OR 17X24 6PK STRL BLUE (TOWEL DISPOSABLE) ×3 IMPLANT
TOWEL OR 17X26 10 PK STRL BLUE (TOWEL DISPOSABLE) ×3 IMPLANT
TOWER SMARTMIX MINI (MISCELLANEOUS) ×1 IMPLANT

## 2016-08-16 NOTE — Anesthesia Preprocedure Evaluation (Addendum)
Anesthesia Evaluation  Patient identified by MRN, date of birth, ID band Patient awake    Reviewed: Allergy & Precautions, NPO status , Patient's Chart, lab work & pertinent test results, reviewed documented beta blocker date and time   History of Anesthesia Complications Negative for: history of anesthetic complications  Airway Mallampati: IV  TM Distance: >3 FB Neck ROM: Full    Dental  (+) Dental Advisory Given   Pulmonary sleep apnea and Continuous Positive Airway Pressure Ventilation ,    breath sounds clear to auscultation       Cardiovascular hypertension, Pt. on medications and Pt. on home beta blockers (-) angina+ dysrhythmias Atrial Fibrillation  Rhythm:Irregular Rate:Normal  '11 ECHO: normal LVF, valves OK '11 stress test: normal LV perfusion   Neuro/Psych Chronic back pain    GI/Hepatic Neg liver ROS, GERD  Medicated and Controlled,  Endo/Other  diabetes, Oral Hypoglycemic AgentsMorbid obesity  Renal/GU negative Renal ROS     Musculoskeletal   Abdominal (+) + obese,   Peds  Hematology negative hematology ROS (+)   Anesthesia Other Findings     Echo 06/20/15 [facility]; Care Everywhere; done during possible CVA w/u-->dx with Bell's Palsy): Summary Normal overall LV systolic function. Estimated LV ejection fraction 60%. No regional wall motion abnormalities were seen, but a complete assessment could not be obtained due to difficult image quality LV diastolic function is normal. Mild left ventricular hypertrophy. No significant valvular stenosis or regurgitation.  06/09/09 Dobutamine stress echo [facility]; scanned under Media tab, Correspondence 06/18/14): Normal resting wall motion and no stress-induced wall motion abnormality. Normal LVF at rest. No wall motion abnormalities noted at adequate work load.  Carotid U/S 06/20/15 [facility]; Care Everywhere; done during possible CVA w/u-->dx  with Bell's Palsy): Summary  1. RIGHT: There is atheroma seen in the carotid arteries with mild-moderate  internal carotid stenosis, 40-59%  2. LEFT: There are elevated velocities in the common carotid artery in an  area not well seen by US. Consider CTA to exclude ostial stenosis. There is  atheroma in the internal carotid artery with 40-59% stenosis.  3. Vertebral flow is antegrade and normal bilaterally.  Reproductive/Obstetrics                            Anesthesia Physical  Anesthesia Plan  ASA: III  Anesthesia Plan: General   Post-op Pain Management: GA combined w/ Regional for post-op pain   Induction: Intravenous  PONV Risk Score and Plan: 2 and Ondansetron, Dexamethasone, Treatment may vary due to age or medical condition and Midazolam  Airway Management Planned: Oral ETT and Video Laryngoscope Planned  Additional Equipment:   Intra-op Plan:   Post-operative Plan: Extubation in OR  Informed Consent: I have reviewed the patients History and Physical, chart, labs and discussed the procedure including the risks, benefits and alternatives for the proposed anesthesia with the patient or authorized representative who has indicated his/her understanding and acceptance.   Dental advisory given  Plan Discussed with: CRNA and Surgeon  Anesthesia Plan Comments: (Plan routine monitors, GETA with VideoGlide intubation)       Anesthesia Quick Evaluation

## 2016-08-16 NOTE — Anesthesia Procedure Notes (Signed)
Procedure Name: Intubation Date/Time: 08/16/2016 10:46 AM Performed by: Marena ChancyBECKNER, Areli Jowett S Pre-anesthesia Checklist: Patient identified, Emergency Drugs available, Suction available and Patient being monitored Patient Re-evaluated:Patient Re-evaluated prior to induction Oxygen Delivery Method: Circle System Utilized Preoxygenation: Pre-oxygenation with 100% oxygen Induction Type: IV induction Ventilation: Mask ventilation without difficulty Laryngoscope Size: Glidescope and 4 Grade View: Grade I Tube type: Oral Tube size: 8.0 mm Number of attempts: 1 Airway Equipment and Method: Stylet,  Oral airway and Video-laryngoscopy Placement Confirmation: ETT inserted through vocal cords under direct vision,  positive ETCO2 and breath sounds checked- equal and bilateral Tube secured with: Tape Dental Injury: Teeth and Oropharynx as per pre-operative assessment

## 2016-08-16 NOTE — H&P (Signed)
Brandon Pena is an 61 y.o. male.   Chief Complaint: L shoulder pain and dysfunction HPI: Endstage L shoulder arthritis with significant pain and dysfunction, failed conservative measures.  Pain interferes with sleep and quality of life.  He has significant posterior wear and subluxation.   Past Medical History:  Diagnosis Date  . Arthritis    osteoarthritis  . Bell's palsy   . Dysrhythmia   . GERD (gastroesophageal reflux disease)   . Headache   . History of atrial fibrillation    reports one time issue in 2012  . History of kidney stones   . Hypercholesteremia   . Hypertension    Does not see a cardiologist  . Sleep apnea    uses CPAP, last sleep study done 8-3216yrs. ago  . Thrombocytopenia (HCC)   . Type II diabetes mellitus (HCC)    Type 2  . Wears glasses     Past Surgical History:  Procedure Laterality Date  . ADENOIDECTOMY    . BICEPS TENDON REPAIR Right 1990's  . CIRCUMCISION  1985  . COLONOSCOPY W/ POLYPECTOMY    . KNEE ARTHROSCOPY Left 1980's  . LUMBAR LAMINECTOMY/DECOMPRESSION MICRODISCECTOMY Left 05/15/2013   Procedure: LUMBAR LAMINECTOMY/DECOMPRESSION MICRODISCECTOMY 1 LEVEL;  Surgeon: Mariam DollarGary P Cram, MD;  Location: [facility];  Service: Neurosurgery;  Laterality: Left;  LUMBAR LAMINECTOMY/DECOMPRESSION MICRODISCECTOMY 1 LEVEL L4-5  . LUMBAR LAMINECTOMY/DECOMPRESSION MICRODISCECTOMY Left 06/18/2014   Procedure: Microdiscectomy - L4-L5 - left redo;  Surgeon: Donalee CitrinGary Cram, MD;  Location: [facility];  Service: Neurosurgery;  Laterality: Left;  . ORIF PATELLA FRACTURE Left 1980's  . SPINAL CORD STIMULATOR INSERTION    . TONSILLECTOMY      Family History  Problem Relation Age of Onset  . Diabetes Mother   . Lung cancer Father   . Parkinson's disease Sister    Social History:  reports that he has never smoked. He has never used smokeless tobacco. He reports that he does not drink alcohol or use drugs.  Allergies: No Known Allergies  Medications Prior to Admission   Medication Sig Dispense Refill  . atenolol (TENORMIN) 50 MG tablet Take 50 mg by mouth daily.    Marland Kitchen. glimepiride (AMARYL) 1 MG tablet Take 1 mg by mouth daily with breakfast.    . HYDROcodone-acetaminophen (NORCO) 7.5-325 MG tablet Take 1 tablet by mouth every 6 (six) hours as needed (for pain.).    Marland Kitchen. losartan (COZAAR) 50 MG tablet Take 50 mg by mouth daily.    . metFORMIN (GLUCOPHAGE) 500 MG tablet Take 500 mg by mouth 2 (two) times daily with a meal.    . omeprazole (PRILOSEC) 20 MG capsule Take 20 mg by mouth 2 (two) times daily before a meal.    . pravastatin (PRAVACHOL) 40 MG tablet Take 40 mg by mouth every evening.     . sucralfate (CARAFATE) 1 g tablet Take 2 g by mouth 2 (two) times daily.    Marland Kitchen. triamterene-hydrochlorothiazide (MAXZIDE-25) 37.5-25 MG per tablet Take 1 tablet by mouth daily.      Results for orders placed or performed during the hospital encounter of 08/16/16 (from the past 48 hour(s))  Glucose, capillary     Status: Abnormal   Collection Time: 08/16/16  8:32 AM  Result Value Ref Range   Glucose-Capillary 120 (H) 65 - 99 mg/dL   Comment 1 Notify RN    Comment 2 Document in Chart   CBC     Status: Abnormal   Collection Time: 08/16/16  8:40 AM  Result Value Ref Range   WBC 8.1 4.0 - 10.5 K/uL   RBC 5.54 4.22 - 5.81 MIL/uL   Hemoglobin 15.1 13.0 - 17.0 g/dL   HCT 16.144.9 09.639.0 - 04.552.0 %   MCV 81.0 78.0 - 100.0 fL   MCH 27.3 26.0 - 34.0 pg   MCHC 33.6 30.0 - 36.0 g/dL   RDW 40.915.7 (H) 81.111.5 - 91.415.5 %   Platelets 104 (L) 150 - 400 K/uL    Comment: REPEATED TO VERIFY SPECIMEN CHECKED FOR CLOTS CONSISTENT WITH PREVIOUS RESULT    No results found.  Review of Systems  All other systems reviewed and are negative.   Blood pressure (!) 114/57, pulse 61, temperature 98 F (36.7 C), temperature source Oral, resp. rate 19, weight (!) 148.3 kg (327 lb), SpO2 99 %. Physical Exam  Constitutional: He is oriented to person, place, and time. He appears well-developed and  well-nourished.  HENT:  Head: Atraumatic.  Eyes: EOM are normal.  Cardiovascular: Intact distal pulses.   Respiratory: Effort normal.  Musculoskeletal:  Left shoulder pain with limited ROM. NVID  Neurological: He is alert and oriented to person, place, and time.  Skin: Skin is warm and dry.  Psychiatric: He has a normal mood and affect.     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

## 2016-08-16 NOTE — Transfer of Care (Signed)
Immediate Anesthesia Transfer of Care Note  Patient: Brandon Pena  Procedure(s) Performed: Procedure(s) with comments: TOTAL SHOULDER ARTHROPLASTY (Left) - Left shoulder arthroplasty  Patient Location: PACU  Anesthesia Type:GA combined with regional for post-op pain  Level of Consciousness: awake, alert  and oriented  Airway & Oxygen Therapy: Patient Spontanous Breathing and Patient connected to nasal cannula oxygen  Post-op Assessment: Report given to RN, Post -op Vital signs reviewed and stable and Patient moving all extremities X 4  Post vital signs: Reviewed and stable  Last Vitals:  Vitals:   08/16/16 0829 08/16/16 0900  BP: (!) 114/57   Pulse: 68 61  Resp: 20 19  Temp: 36.7 C     Last Pain:  Vitals:   08/16/16 0829  TempSrc: Oral         Complications: No apparent anesthesia complications

## 2016-08-16 NOTE — Anesthesia Postprocedure Evaluation (Signed)
Anesthesia Post Note  Patient: Brandon Pena  Procedure(s) Performed: Procedure(s) (LRB): TOTAL SHOULDER ARTHROPLASTY (Left)     Patient location during evaluation: PACU Anesthesia Type: General Level of consciousness: awake and alert Pain management: pain level controlled Vital Signs Assessment: post-procedure vital signs reviewed and stable Respiratory status: spontaneous breathing, nonlabored ventilation, respiratory function stable and patient connected to nasal cannula oxygen Cardiovascular status: blood pressure returned to baseline and stable Postop Assessment: no signs of nausea or vomiting Anesthetic complications: no    Last Vitals:  Vitals:   08/16/16 1359 08/16/16 1400  BP: (!) 126/58   Pulse: 75 76  Resp: 17 18  Temp:      Last Pain:  Vitals:   08/16/16 1353  TempSrc:   PainSc: 0-No pain                 Darreon Lutes

## 2016-08-16 NOTE — Op Note (Signed)
Procedure(s): TOTAL SHOULDER ARTHROPLASTY Procedure Note  Brandon Pena male 61 y.o. 08/16/2016  Procedure(s) and Anesthesia Type:  #1 L TOTAL SHOULDER ARTHROPLASTY - Choice #2   left shoulder proximal long head biceps tenodesis  Surgeon(s) and Role:    Jones Broom, MD - Primary   Indications:  61 y.o. 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 This was related to his injury at work.. Pain and dysfunction interfered with quality of life and nonoperative treatment with activity modification, NSAIDS and injections failed.     Surgeon: Mable Paris   Assistants: Damita Lack PA-C [facility] was present and scrubbed throughout the procedure and was essential in positioning, retraction, exposure, and closure)  Anesthesia: General endotracheal anesthesia with preoperative interscalene block given by the attending anesthesiologist    Procedure Detail  TOTAL SHOULDER ARTHROPLASTY  Findings: Tornier flex anatomic press-fit size 3 stem with a 48 head, cemented size medium 25 augmented perform plus Cortiloc glenoid.  A lesser tuberosity osteotomy was performed and repaired at the conclusion of the procedure.  Estimated Blood Loss:  200 mL         Drains: None   Blood Given: none          Specimens: none        Complications:  * No complications entered in OR log *         Disposition: PACU - hemodynamically stable.         Condition: stable    Procedure:   The patient was identified in the preoperative holding area where I personally marked the operative extremity after verifying with the patient and consent. He  was taken to the operating room where He was transferred to the   operative table.  The patient received an interscalene block in   the holding area by the attending anesthesiologist.  General anesthesia was induced   in the operating room without complication.  The patient  did receive IV  Ancef prior to the commencement of the procedure.  The patient was   placed in the beach-chair position with the back raised about 30   degrees.  The nonoperative extremity and head and neck were carefully   positioned and padded protecting against neurovascular compromise.  The   left upper extremity was then prepped and draped in the standard sterile   fashion.    The appropriate operative time-out was performed with   Anesthesia, the perioperative staff, as well as myself and we all agreed   that the left side was the correct operative site. The patient received 1 g IV tranexamic acid at the start of the case around time of the incision.  An approximately   10 cm incision was made from the tip of the coracoid to the center point of the   humerus at the level of the axilla.  Dissection was carried down sharply   through subcutaneous tissues and cephalic vein was identified and taken   laterally with the deltoid.  The pectoralis major was taken medially.  The   upper 1 cm of the pectoralis major was released from its attachment on   the humerus.  The clavipectoral fascia was incised just lateral to the   conjoined tendon.  This incision was carried up to but not into the   coracoacromial ligament.  Digital palpation was used to prove   integrity of the axillary nerve which was protected throughout the   procedure.  Musculocutaneous nerve was not palpated in the operative   field.  Conjoined tendon was then retracted gently medially and the   deltoid laterally.  Anterior circumflex humeral vessels were clamped and   coagulated.  The soft tissues overlying the biceps was incised and this   incision was carried across the transverse humeral ligament to the base   of the coracoid.  The biceps was noted to be severely degenerated. It was released from the superior labrum. The biceps was then tenodesed to the soft tissue just above   pectoralis major and the remaining portion of  the biceps superiorly was   excised.  An osteotomy was performed at the lesser tuberosity.  The capsule was then   released all the way down to the 6 o'clock position of the humeral head.   The humeral head was then delivered with simultaneous adduction,   extension and external rotation.  All humeral osteophytes were removed   and the anatomic neck of the humerus was marked and cut free hand at   approximately 25 degrees retroversion within about 3 mm of the cuff   reflection posteriorly.  The head size was estimated to be a   offset.  At that point, the humeral head was retracted posteriorly with   a Fukuda retractor.   Remaining portion of the capsule was released at the base of the   coracoid.  The remaining biceps anchor and the entire anterior-inferior   labrum was excised.  The posterior labrum was also excised but the   posterior capsule was not released.  The guidepin was placed bicortically with the anterior glenoid referenced guide.  The reamer was used to ream to concentric bone with punctate bleeding over the anterior half of the glenoid just beyond the pin. The anterior and lateral rotation hole was then drilled and the angled posterior reamer was then used starting at 15 and advancing the 25 3 in the posterior half of the glenoid.  This gave an excellent concentric surface for the trial. It fit well with no rocking..  The center hole was then drilled for an anchor peg glenoid followed by the three peripheral holes and none of the holes   exited the glenoid wall.  I then pulse irrigated these holes and dried   them with Surgicel.  The three peripheral holes were then   pressurized cemented and the perform + 25 anchor peg glenoid was placed and impacted   with an excellent fit. .  The proximal humerus was then again exposed taking care not to displace the glenoid.    The entry awl was used followed by sounding reamers and then sequentially broached from size 2 to 3. This  was then left in place and the calcar planer was used. Trial head was placed with a 48.  With the trial implantation of the component,  there was approximately 50% posterior translation with slight residual posterior laxity but no tendency towards complete dislocation at the back.      The trial was removed and the final implant was prepared on a back table.  The trial was removed and the final implant was prepared on a back table.   3 small holes were drilled on the medial side of the lesser tuberosity osteotomy, through which 2 labral tapes were passed. The implant was then placed through the loop of the 2 labral tapes and impacted with an excellent press-fit. This achieved excellent anatomic reconstruction of the proximal humerus.  The  joint was then copiously irrigated with pulse lavage.  The subscapularis and   lesser tuberosity osteotomy were then repaired using the 2 labral tapes previously passed in a double row fashion with horizontal mattress sutures medially brought over through bone tunnels tied over a bone bridge laterally.   One #1 Ethibond was placed at the rotator interval just above   the lesser tuberosity. An additional Ethibond was placed more medial in the rotator interval given his slight residual posterior laxity. Copious irrigation was used. Throughout the case a mixture of 20 mL liposomal bupivacaine and 40 mL normal saline was used to infiltrate the deep capsular tissue, bony surfaces and subcutaneous tissue. Skin was closed with 2-0 Vicryl sutures in the deep dermal layer and 4-0 Monocryl in a subcuticular  running fashion.  Sterile dressings were then applied including Aquacel.  The patient was placed in a sling and allowed to awaken from general anesthesia and taken to the recovery room in stable condition.      POSTOPERATIVE PLAN:  Early passive range of motion will be restricted.  He will work on hand wrist and elbow motion only initially.  No active motion of the arm until the  lesser tuberosity heals.  The patient will likely be kept in the hospital for 1-2 days and then discharged home.

## 2016-08-16 NOTE — Anesthesia Procedure Notes (Signed)
Anesthesia Regional Block: Interscalene brachial plexus block   Pre-Anesthetic Checklist: ,, timeout performed, Correct Patient, Correct Site, Correct Laterality, Correct Procedure, Correct Position, site marked, Risks and benefits discussed,  Surgical consent,  Pre-op evaluation,  At surgeon's request and post-op pain management  Laterality: Left  Prep: chloraprep       Needles:  Injection technique: Single-shot  Needle Type: Echogenic Stimulator Needle     Needle Length: 5cm  Needle Gauge: 22     Additional Needles:   Procedures: ultrasound guided, nerve stimulator,,,,,,  Narrative:  Start time: 08/16/2016 9:15 AM End time: 08/16/2016 9:28 AM Injection made incrementally with aspirations every 5 mL.  Performed by: Personally  Anesthesiologist: Apolinar Bero  Additional Notes: Functioning IV was confirmed and monitors were applied.  A 50mm 22ga Arrow echogenic stimulator needle was used. Sterile prep and drape,hand hygiene and sterile gloves were used. Ultrasound guidance: relevant anatomy identified, needle position confirmed, local anesthetic spread visualized around nerve(s)., vascular puncture avoided.  Image printed for medical record. Negative aspiration and negative test dose prior to incremental administration of local anesthetic. The patient tolerated the procedure well.

## 2016-08-17 ENCOUNTER — Encounter (HOSPITAL_COMMUNITY): Payer: Self-pay | Admitting: Orthopedic Surgery

## 2016-08-17 LAB — CBC
HCT: 40.5 % (ref 39.0–52.0)
Hemoglobin: 13.6 g/dL (ref 13.0–17.0)
MCH: 27.1 pg (ref 26.0–34.0)
MCHC: 33.6 g/dL (ref 30.0–36.0)
MCV: 80.8 fL (ref 78.0–100.0)
PLATELETS: 73 10*3/uL — AB (ref 150–400)
RBC: 5.01 MIL/uL (ref 4.22–5.81)
RDW: 15.4 % (ref 11.5–15.5)
WBC: 10 10*3/uL (ref 4.0–10.5)

## 2016-08-17 LAB — BASIC METABOLIC PANEL
ANION GAP: 8 (ref 5–15)
BUN: 21 mg/dL — ABNORMAL HIGH (ref 6–20)
CALCIUM: 9.1 mg/dL (ref 8.9–10.3)
CO2: 21 mmol/L — ABNORMAL LOW (ref 22–32)
Chloride: 102 mmol/L (ref 101–111)
Creatinine, Ser: 1.12 mg/dL (ref 0.61–1.24)
GFR calc Af Amer: >60 mL/min (ref >60)
GLUCOSE: 155 mg/dL — AB (ref 65–99)
Potassium: 3.2 mmol/L — ABNORMAL LOW (ref 3.5–5.1)
SODIUM: 131 mmol/L — AB (ref 135–145)

## 2016-08-17 LAB — GLUCOSE, CAPILLARY: Glucose-Capillary: 121 mg/dL — ABNORMAL HIGH (ref 65–99)

## 2016-08-17 MED ORDER — POTASSIUM CHLORIDE 20 MEQ PO PACK: 40.0000 meq | PACK | Freq: Once | ORAL | Status: DC

## 2016-08-17 MED ORDER — OXYCODONE-ACETAMINOPHEN 5-325 MG PO TABS: 1.0000 | ORAL_TABLET | ORAL | 0 refills | Status: DC | PRN

## 2016-08-17 MED ORDER — DOCUSATE SODIUM 100 MG PO CAPS: 100.0000 mg | ORAL_CAPSULE | Freq: Three times a day (TID) | ORAL | 0 refills | Status: DC | PRN

## 2016-08-17 NOTE — Plan of Care (Signed)
Problem: Safety: Goal: Ability to remain free from injury will improve Personal belongings and call light in reach. Patient calls for assistance

## 2016-08-17 NOTE — Discharge Summary (Signed)
Patient ID: Brandon Pena MRN: [id number] DOB/AGE: 09-04-1955 61 y.o.  Admit date: 08/16/2016 Discharge date: 08/17/2016  Admission Diagnoses:  Active Problems:   S/P shoulder replacement, left   Discharge Diagnoses:  Same  Past Medical History:  Diagnosis Date  . Arthritis    osteoarthritis  . Bell's palsy   . Dysrhythmia   . GERD (gastroesophageal reflux disease)   . Headache   . History of atrial fibrillation    reports one time issue in 2012  . History of kidney stones   . Hypercholesteremia   . Hypertension    Does not see a cardiologist  . Sleep apnea    uses CPAP, last sleep study done 8-6646yrs. ago  . Thrombocytopenia (HCC)   . Type II diabetes mellitus (HCC)    Type 2  . Wears glasses     Surgeries: Procedure(s): TOTAL SHOULDER ARTHROPLASTY on 08/16/2016   Consultants:   Discharged Condition: Improved  Hospital Course: Brandon JoCharlie W Thomassen is an 61 y.o. male who was admitted 08/16/2016 for operative treatment of left total shoulder arthroplasty. Patient has severe unremitting pain that affects sleep, daily activities, and work/hobbies. After pre-op clearance the patient was taken to the operating room on 08/16/2016 and underwent  Procedure(s): TOTAL SHOULDER ARTHROPLASTY.    Patient was given perioperative antibiotics: Anti-infectives    Start     Dose/Rate Route Frequency Ordered Stop   08/16/16 1600  ceFAZolin (ANCEF) IVPB 2g/100 mL premix     2 g 200 mL/hr over 30 Minutes Intravenous Every 6 hours 08/16/16 1555 08/17/16 0405   08/16/16 0800  ceFAZolin (ANCEF) 3 g in dextrose 5 % 50 mL IVPB     3 g 130 mL/hr over 30 Minutes Intravenous To ShortStay Surgical 08/15/16 0759 08/16/16 1052       Patient was given sequential compression devices, early ambulation, and asa to prevent DVT.  Patient benefited maximally from hospital stay and there were no complications.    Recent vital signs: Patient Vitals for the past 24 hrs:  BP Temp Temp src Pulse Resp SpO2 Weight   08/17/16 0400 122/63 98.4 F (36.9 C) Oral 82 14 93 % -  08/17/16 0000 125/69 98.8 F (37.1 C) Oral 76 16 100 % -  08/16/16 2000 113/60 99.5 F (37.5 C) Oral 87 18 94 % -  08/16/16 1602 126/67 98.6 F (37 C) Oral 81 - 98 % -  08/16/16 1530 - (!) 97.3 F (36.3 C) - 73 19 100 % -  08/16/16 1515 - - - 72 18 98 % -  08/16/16 1509 (!) 142/88 - - 75 17 100 % -  08/16/16 1500 - - - 68 17 98 % -  08/16/16 1454 137/84 - - 73 16 100 % -  08/16/16 1445 - - - 66 17 100 % -  08/16/16 1439 139/71 - - 70 18 98 % -  08/16/16 1430 - - - 69 18 100 % -  08/16/16 1424 137/61 - - 71 (!) 21 100 % -  08/16/16 1400 - - - 76 18 94 % -  08/16/16 1359 (!) 126/58 - - 75 17 96 % -  08/16/16 1355 (!) 152/129 - - 79 20 92 % -  08/16/16 1353 - 98.3 F (36.8 C) - 76 11 94 % -  08/16/16 0900 - - - 61 19 99 % -  08/16/16 0829 (!) 114/57 98 F (36.7 C) Oral 68 20 98 % (!) 148.3 kg (327 lb)  Recent laboratory studies:  Recent Labs  08/16/16 0840 08/17/16 0355  WBC 8.1 10.0  HGB 15.1 13.6  HCT 44.9 40.5  PLT 104* 73*  NA  --  131*  K  --  3.2*  CL  --  102  CO2  --  21*  BUN  --  21*  CREATININE  --  1.12  GLUCOSE  --  155*  CALCIUM  --  9.1     Discharge Medications:   Allergies as of 08/17/2016   No Known Allergies     Medication List    STOP taking these medications   HYDROcodone-acetaminophen 7.5-325 MG tablet Commonly known as:  NORCO     TAKE these medications   atenolol 50 MG tablet Commonly known as:  TENORMIN Take 50 mg by mouth daily.   docusate sodium 100 MG capsule Commonly known as:  COLACE Take 1 capsule (100 mg total) by mouth 3 (three) times daily as needed.   glimepiride 1 MG tablet Commonly known as:  AMARYL Take 1 mg by mouth daily with breakfast.   losartan 50 MG tablet Commonly known as:  COZAAR Take 50 mg by mouth daily.   metFORMIN 500 MG tablet Commonly known as:  GLUCOPHAGE Take 500 mg by mouth 2 (two) times daily with a meal.   omeprazole 20 MG  capsule Commonly known as:  PRILOSEC Take 20 mg by mouth 2 (two) times daily before a meal.   oxyCODONE-acetaminophen 5-325 MG tablet Commonly known as:  ROXICET Take 1-2 tablets by mouth every 4 (four) hours as needed for severe pain.   pravastatin 40 MG tablet Commonly known as:  PRAVACHOL Take 40 mg by mouth every evening.   sucralfate 1 g tablet Commonly known as:  CARAFATE Take 2 g by mouth 2 (two) times daily.   triamterene-hydrochlorothiazide 37.5-25 MG tablet Commonly known as:  MAXZIDE-25 Take 1 tablet by mouth daily.       Diagnostic Studies: Dg Chest 2 View  Result Date: 08/13/2016 CLINICAL DATA:  Preoperative chest radiograph prior to shoulder surgery. EXAM: CHEST  2 VIEW COMPARISON:  05/07/2013 and prior chest radiographs FINDINGS: The cardiomediastinal silhouette is unremarkable. Mild chronic peribronchial thickening/ interstitial prominence is unchanged. There is no evidence of focal airspace disease, pulmonary edema, suspicious pulmonary nodule/mass, pleural effusion, or pneumothorax. No acute bony abnormalities are identified. A thoracic cord stimulator is noted. IMPRESSION: No evidence of acute cardiopulmonary disease. Electronically Signed   By: Harmon Pier M.D.   On: 08/13/2016 10:38   Dg Shoulder Left Port  Result Date: 08/16/2016 CLINICAL DATA:  Shoulder arthroplasty left EXAM: LEFT SHOULDER - 1 VIEW COMPARISON:  CT 03/29/2016 FINDINGS: Left shoulder arthroplasty in satisfactory position alignment. No immediate complication. Degenerative spurring of the [facility] joint. IMPRESSION: Satisfactory shoulder replacement. Electronically Signed   By: Marlan Palau M.D.   On: 08/16/2016 14:25    Disposition: 01-Home or Self Care  Discharge Instructions    Call MD / Call 911    Complete by:  As directed    If you experience chest pain or shortness of breath, CALL 911 and be transported to the hospital emergency room.  If you develope a fever above 101 F, pus (white  drainage) or increased drainage or redness at the wound, or calf pain, call your surgeon's office.   Constipation Prevention    Complete by:  As directed    Drink plenty of fluids.  Prune juice may be helpful.  You may use a stool  softener, such as Colace (over the counter) 100 mg twice a day.  Use MiraLax (over the counter) for constipation as needed.   Diet - low sodium heart healthy    Complete by:  As directed    Increase activity slowly as tolerated    Complete by:  As directed       Follow-up Information    Jones Broomhandler, Justin, MD. Schedule an appointment as soon as possible for a visit in 2 week(s).   Specialty:  Orthopedic Surgery Contact information: [address] [place] [id number] [phone]            Signed: Jiles HaroldLALIBERTE, Myranda Pavone 08/17/2016, 8:21 AM

## 2016-08-17 NOTE — Progress Notes (Signed)
   PATIENT ID: Brandon Pena   1 Day Post-Op Procedure(s) (LRB): TOTAL SHOULDER ARTHROPLASTY (Left)  Subjective: Patient stated he had some overnight pain which resulted in difficulty sleeping. He was given pain medications which he states helped his pain. Patient is eager and excited to be discharged today.   Objective:  Vitals:   08/17/16 0000 08/17/16 0400  BP: 125/69 122/63  Pulse: 76 82  Resp: 16 14  Temp: 98.8 F (37.1 C) 98.4 F (36.9 C)     Alert and oriented x3. He was sitting comfortably in chair next to bed, wearing left arm sling, able to converse easily. He has full ROM of elbow and wrist without pain. Left upper extremity was distally neurovascularly intact. Dressing intact and mostly dry except for a quarter sized amount of bloody drainage on bandage. No signs of localized infection around incision.   Labs:   Recent Labs  08/16/16 0840 08/17/16 0355  HGB 15.1 13.6   Recent Labs  08/16/16 0840 08/17/16 0355  WBC 8.1 10.0  RBC 5.54 5.01  HCT 44.9 40.5  PLT 104* 73*   Recent Labs  08/17/16 0355  NA 131*  K 3.2*  CL 102  CO2 21*  BUN 21*  CREATININE 1.12  GLUCOSE 155*  CALCIUM 9.1    Assessment and Plan:   1. Day 1 status post left total shoulder arthroplasty.  Patient will be discharged to home today. OT to visit prior to discharge to go over physical therapy movements isolated to wrist and elbow. He was given prescriptions for pain medications, located in his chart. Patient instructed to not remove dressing from incision, wear the sling at all times except to bathe.  He will follow up in our office in two weeks.   2. Thrombocytopenia Prior history of thrombocytopenia, as noted today with platelet count of 73. This is known by his PCP and is being managed by same.   3. Hypokalemia Patient will be given oral potassium supplementation  to manage low potassium.    VTE proph: ASA QD, SCDs

## 2016-08-17 NOTE — Progress Notes (Signed)
Discharge instructions printed and reviewed with patient and spouse, and copy given for them to take home. All questions addressed at this time. New prescriptions reviewed. IV removed. Room searched for patient belongings, and confirmed with patient that all valuables were accounted for. Spouse assisted patient to dress, then staff escorted patient to discharge via wheelchair.  

## 2016-08-17 NOTE — Progress Notes (Signed)
OT Evaluation  Completed all education regarding compensatory techniques for ADL and management of LUE. Brandon Pena/wfie verbalized understanding. Brandon Pena to follow up with Dr. Ave Filterhandler to progress rehab of shoulder.    08/17/16 0900  OT Visit Information  Last OT Received On 08/17/16  Assistance Needed +1  History of Present Illness L TSA  Precautions  Precautions Shoulder  Type of Shoulder Precautions conservative - no shoulder ROM; elbow/wrist/hand only - see orders  Shoulder Interventions Shoulder sling/immobilizer  Precaution Booklet Issued Yes (comment)  Required Braces or Orthoses Sling  Restrictions  Weight Bearing Restrictions Yes  LUE Weight Bearing NWB  Home Living  Family/patient expects to be discharged to: Private residence  Available Help at Discharge Family;Available 24 hours/day (2weeks)  Type of Home House  Home Access Stairs to enter  Entrance Stairs-Number of Steps 1  Home Layout One level  Bathroom Shower/Tub Tub/shower unit;Curtain  Horticulturist, commercialBathroom Toilet Standard  Bathroom Accessibility Yes  How Accessible Accessible via walker  Home Equipment Cane - single point  Prior Function  Level of Independence Independent  Communication  Communication No difficulties  Pain Assessment  Pain Assessment 0-10  Pain Score 5  Pain Location L shoulder  Pain Descriptors / Indicators Aching  Pain Intervention(s) Limited activity within patient's tolerance;Ice applied;Repositioned  Cognition  Arousal/Alertness Awake/alert  Behavior During Therapy WFL for tasks assessed/performed  Overall Cognitive Status Within Functional Limits for tasks assessed  Upper Extremity Assessment  Upper Extremity Assessment LUE deficits/detail  LUE Deficits / Details elbow/wirst/hand WFL.  LUE Coordination decreased gross motor  Lower Extremity Assessment  Lower Extremity Assessment Overall WFL for tasks assessed  Cervical / Trunk Assessment  Cervical / Trunk Assessment Normal (hx of back surgery)   ADL  Overall ADL's  Needs assistance/impaired  Functional mobility during ADLs Modified independent  General ADL Comments completed education regarding compensatory technqieus for ADL, sling managment and position/edema control. Completed ADL session with Brandon Pena/wife to review techqnieus. Brandon Pena/wife able to return demosntrate.  Bed Mobility  Overal bed mobility Modified Independent  Transfers  Overall transfer level Modified independent  Exercises  Exercises Shoulder  Shoulder Instructions  Donning/doffing shirt without moving shoulder Caregiver independent with task;Patient able to independently direct caregiver  Method for sponge bathing under operated UE Caregiver independent with task;Patient able to independently direct caregiver;Minimal assistance  Donning/doffing sling/immobilizer Patient able to independently direct caregiver;Minimal assistance;Caregiver independent with task  Correct positioning of sling/immobilizer Modified independent  ROM for elbow, wrist and digits of operated UE Independent  Sling wearing schedule (on at all times/off for ADL's) Independent  Proper positioning of operated UE when showering Independent  Positioning of UE while sleeping Independent  Shoulder Exercises  Elbow Flexion AROM;Left;10 reps  Elbow Extension AROM;Left;10 reps  Wrist Flexion AROM;Left;10 reps  Wrist Extension AROM;Left;10 reps  Digit Composite Flexion AROM;Left;10 reps  OT - End of Session  Activity Tolerance Patient tolerated treatment well  Patient left in chair;with call bell/phone within reach  Nurse Communication Other (comment) (ready for DC)  OT Assessment  OT Recommendation/Assessment Progress rehab of shoulder as ordered by MD at follow-up appointment  OT Visit Diagnosis Pain;Muscle weakness (generalized) (M62.81)  Pain - Right/Left Left  Pain - part of body Shoulder  OT Problem List Decreased strength;Decreased range of motion;Decreased knowledge of precautions;Obesity;Pain   AM-PAC OT "6 Clicks" Daily Activity Outcome Measure  Help from another person eating meals? 4  Help from another person taking care of personal grooming? 3  Help from another person toileting, which includes using  toliet, bedpan, or urinal? 3  Help from another person bathing (including washing, rinsing, drying)? 3  Help from another person to put on and taking off regular upper body clothing? 2  Help from another person to put on and taking off regular lower body clothing? 3  6 Click Score 18  ADL G Code Conversion CK  OT Recommendation  Follow Up Recommendations DC plan and follow up therapy as arranged by surgeon  OT Equipment None recommended by OT  Acute Rehab OT Goals  Patient Stated Goal to "shoot" again  OT Goal Formulation All assessment and education complete, DC therapy  OT Time Calculation  OT Start Time (ACUTE ONLY) 0947  OT Stop Time (ACUTE ONLY) 1032  OT Time Calculation (min) 45 min  OT General Charges  $OT Visit 1 Procedure  OT Evaluation  $OT Eval Moderate Complexity 1 Procedure  OT Treatments  $Self Care/Home Management  8-22 mins  $Therapeutic Exercise 8-22 mins  Written Expression  Dominant Hand Left  [facility] Hospital[facility] Brok Stocking, OT/L  [phone] 08/17/2016

## 2016-08-17 NOTE — Discharge Instructions (Signed)
Discharge Instructions after Total Shoulder Arthroplasty ° ° °A sling has been provided for you. You are to where this at all times, even while sleeping, until your first post operative visit with Dr. Chandler. °Use ice on the shoulder intermittently over the first 48 hours after surgery.  °Pain medicine has been prescribed for you.  °Use your medicine liberally over the first 48 hours, and then you can begin to taper your use. You may take Extra Strength Tylenol or Tylenol only in place of the pain pills. DO NOT take ANY nonsteroidal anti-inflammatory pain medications: Advil, Motrin, Ibuprofen, Aleve, Naproxen or Naprosyn.  °Take one aspirin a day for 2 weeks after surgery, unless you have an aspirin sensitivity/allergy or asthma.  °Leave your dressing on until your first follow up visit.  You may shower with the dressing.  Hold your arm as if you still have your sling on while you shower. °Simply allow the water to wash over the site and then pat dry. Make sure your axilla (armpit) is completely dry after showering. ° ° ° °Please call [phone] during normal business hours or [phone] after hours for any problems. Including the following: ° °- excessive redness of the incisions °- drainage for more than 4 days °- fever of more than 101.5 F ° °*Please note that pain medications will not be refilled after hours or on weekends. ° ° ° ° °

## 2023-09-18 ENCOUNTER — Other Ambulatory Visit: Payer: Self-pay | Admitting: Neurological Surgery

## 2023-09-23 NOTE — Progress Notes (Signed)
 One Call Questionnaire reviewed.

## 2023-09-23 NOTE — Progress Notes (Signed)
    [facility] ONE CALL QUESTIONNAIRE- HOSPITAL  Prep Type: EGD  Procedure Date: 10/07/23  Procedure Time: 0800 MD: Jefrey Facility: [facility]  Brandon Pena,  [birth year],  68 y.o.,  [id number]  Information obtained from: Self   Previous exam: EGD Provider TRB Year 2023  Diagnostic  Esophageal Varicies  Personal History of: Negative for  Colon Cancer / colon Polyp(s) Family History of:  Negative for  Colon Cancer / colon Polyp(s)  N/A  Cardiac / Neuro Clearance sent:  Not Applicable  Patient confirmed receipt of prep: No, emailed to [email or web address]  Confirmation of Blood Thinner Hold and patient aware: N/A   Chronic Constipation: no Chronic Diarrhea: no  Reason Patient is being scheduled at the Hospital: Varices banding  Additional Notes: Pt has spinal cord stimulator and is aware to bring remote to turn off or      Staff completing form: Grayce Gent, RN    09/23/2023

## 2023-09-26 NOTE — Progress Notes (Signed)
 Surgical Instructions   Your procedure is scheduled on Tuesday, September 16th, 2025. Report to [facility] Main Entrance A at 8:15 A.M., then check in with the Admitting office. Any questions or running late day of surgery: call [phone]  Questions prior to your surgery date: call [phone], Monday-Friday, 8am-4pm. If you experience any cold or flu symptoms such as cough, fever, chills, shortness of breath, etc. between now and your scheduled surgery, please notify us  at the above number.     Remember:  Do not eat or drink after midnight the night before your surgery    Take these medicines the morning of surgery with A SIP OF WATER: Carvedilol (Coreg) Omeprazole (Prilosec)   May take these medicines IF NEEDED: Oxycodone -acetaminophen  (Percocet)    One week prior to surgery, STOP taking any Aspirin  (unless otherwise instructed by your surgeon) Aleve, Naproxen, Ibuprofen , Motrin , Advil , Goody's, BC's, all herbal medications, fish oil, and non-prescription vitamins.   WHAT DO I DO ABOUT MY DIABETES MEDICATION?   Mounjaro should be stopped 7 days prior to your surgery.  Your last dose of Mounjaro should be on or before September 8th.      HOW TO MANAGE YOUR DIABETES BEFORE AND AFTER SURGERY  Why is it important to control my blood sugar before and after surgery? Improving blood sugar levels before and after surgery helps healing and can limit problems. A way of improving blood sugar control is eating a healthy diet by:  Eating less sugar and carbohydrates  Increasing activity/exercise  Talking with your doctor about reaching your blood sugar goals High blood sugars (greater than 180 mg/dL) can raise your risk of infections and slow your recovery, so you will need to focus on controlling your diabetes during the weeks before surgery. Make sure that the doctor who takes care of your diabetes knows about your planned surgery including the date and location.  How do I  manage my blood sugar before surgery? Check your blood sugar at least 4 times a day, starting 2 days before surgery, to make sure that the level is not too high or low.  Check your blood sugar the morning of your surgery when you wake up and every 2 hours until you get to the Short Stay unit.  If your blood sugar is less than 70 mg/dL, you will need to treat for low blood sugar: Do not take insulin . Treat a low blood sugar (less than 70 mg/dL) with  cup of clear juice (cranberry or apple), 4 glucose tablets, OR glucose gel. Recheck blood sugar in 15 minutes after treatment (to make sure it is greater than 70 mg/dL). If your blood sugar is not greater than 70 mg/dL on recheck, call [phone] for further instructions. Report your blood sugar to the short stay nurse when you get to Short Stay.  If you are admitted to the hospital after surgery: Your blood sugar will be checked by the staff and you will probably be given insulin  after surgery (instead of oral diabetes medicines) to make sure you have good blood sugar levels. The goal for blood sugar control after surgery is 80-180 mg/dL.                      Do NOT Smoke (Tobacco/Vaping) for 24 hours prior to your procedure.  If you use a CPAP at night, you may bring your mask/headgear for your overnight stay.   You will be asked to remove any contacts, glasses, piercing's, hearing aid's,  dentures/partials prior to surgery. Please bring cases for these items if needed.    Patients discharged the day of surgery will not be allowed to drive home, and someone needs to stay with them for 24 hours.  SURGICAL WAITING ROOM VISITATION Patients may have no more than 2 support people in the waiting area - these visitors may rotate.   Pre-op nurse will coordinate an appropriate time for 1 ADULT support person, who may not rotate, to accompany patient in pre-op.  Children under the age of 69 must have an adult with them who is not the patient and  must remain in the main waiting area with an adult.  If the patient needs to stay at the hospital during part of their recovery, the visitor guidelines for inpatient rooms apply.  Please refer to the [facility] website for the visitor guidelines for any additional information.   If you received a COVID test during your pre-op visit  it is requested that you wear a mask when out in public, stay away from anyone that may not be feeling well and notify your surgeon if you develop symptoms. If you have been in contact with anyone that has tested positive in the last 10 days please notify you surgeon.      Pre-operative CHG Bathing Instructions   You can play a key role in reducing the risk of infection after surgery. Your skin needs to be as free of germs as possible. You can reduce the number of germs on your skin by washing with CHG (chlorhexidine  gluconate) soap before surgery. CHG is an antiseptic soap that kills germs and continues to kill germs even after washing.   DO NOT use if you have an allergy to chlorhexidine /CHG or antibacterial soaps. If your skin becomes reddened or irritated, stop using the CHG and notify one of our RNs at [phone].              TAKE A SHOWER THE NIGHT BEFORE SURGERY AND THE DAY OF SURGERY    Please keep in mind the following:  DO NOT shave, including legs and underarms, 48 hours prior to surgery.   You may shave your face before/day of surgery.  Place clean sheets on your bed the night before surgery Use a clean washcloth (not used since being washed) for each shower. DO NOT sleep with pet's night before surgery.  CHG Shower Instructions:  Wash your face and private area with normal soap. If you choose to wash your hair, wash first with your normal shampoo.  After you use shampoo/soap, rinse your hair and body thoroughly to remove shampoo/soap residue.  Turn the water OFF and apply half the bottle of CHG soap to a CLEAN washcloth.  Apply CHG soap  ONLY FROM YOUR NECK DOWN TO YOUR TOES (washing for 3-5 minutes)  DO NOT use CHG soap on face, private areas, open wounds, or sores.  Pay special attention to the area where your surgery is being performed.  If you are having back surgery, having someone wash your back for you may be helpful. Wait 2 minutes after CHG soap is applied, then you may rinse off the CHG soap.  Pat dry with a clean towel  Put on clean pajamas    Additional instructions for the day of surgery: DO NOT APPLY any lotions, deodorants, cologne, or perfumes.   Do not wear jewelry or makeup Do not wear nail polish, gel polish, artificial nails, or any other type of covering on  natural nails (fingers and toes) Do not bring valuables to the hospital. [facility] is not responsible for valuables/personal belongings. Put on clean/comfortable clothes.  Please brush your teeth.  Ask your nurse before applying any prescription medications to the skin.

## 2023-09-27 ENCOUNTER — Encounter (HOSPITAL_COMMUNITY): Payer: Self-pay

## 2023-09-27 ENCOUNTER — Other Ambulatory Visit: Payer: Self-pay

## 2023-09-27 ENCOUNTER — Encounter (HOSPITAL_COMMUNITY)
Admission: RE | Admit: 2023-09-27 | Discharge: 2023-09-27 | Disposition: A | Discharge status: Home / Self Care | Payer: Self-pay | Source: Ambulatory Visit | Attending: Neurological Surgery | Admitting: Neurological Surgery

## 2023-09-27 VITALS — BP 128/64 | HR 65 | Temp 97.8°F | Resp 18 | Ht 71.0 in | Wt 310.0 lb

## 2023-09-27 DIAGNOSIS — I4891 Unspecified atrial fibrillation: Secondary | ICD-10-CM | POA: Insufficient documentation

## 2023-09-27 DIAGNOSIS — I10 Essential (primary) hypertension: Secondary | ICD-10-CM | POA: Insufficient documentation

## 2023-09-27 DIAGNOSIS — Q631 Lobulated, fused and horseshoe kidney: Secondary | ICD-10-CM | POA: Diagnosis not present

## 2023-09-27 DIAGNOSIS — E119 Type 2 diabetes mellitus without complications: Secondary | ICD-10-CM | POA: Insufficient documentation

## 2023-09-27 DIAGNOSIS — I8510 Secondary esophageal varices without bleeding: Secondary | ICD-10-CM | POA: Diagnosis not present

## 2023-09-27 DIAGNOSIS — K7460 Unspecified cirrhosis of liver: Secondary | ICD-10-CM | POA: Insufficient documentation

## 2023-09-27 DIAGNOSIS — Z7985 Long-term (current) use of injectable non-insulin antidiabetic drugs: Secondary | ICD-10-CM | POA: Diagnosis not present

## 2023-09-27 DIAGNOSIS — R160 Hepatomegaly, not elsewhere classified: Secondary | ICD-10-CM | POA: Diagnosis not present

## 2023-09-27 DIAGNOSIS — K7581 Nonalcoholic steatohepatitis (NASH): Secondary | ICD-10-CM | POA: Diagnosis not present

## 2023-09-27 DIAGNOSIS — Z96612 Presence of left artificial shoulder joint: Secondary | ICD-10-CM | POA: Diagnosis not present

## 2023-09-27 DIAGNOSIS — E7800 Pure hypercholesterolemia, unspecified: Secondary | ICD-10-CM | POA: Insufficient documentation

## 2023-09-27 DIAGNOSIS — D61818 Other pancytopenia: Secondary | ICD-10-CM | POA: Diagnosis not present

## 2023-09-27 DIAGNOSIS — G4733 Obstructive sleep apnea (adult) (pediatric): Secondary | ICD-10-CM | POA: Diagnosis not present

## 2023-09-27 DIAGNOSIS — T85192A Other mechanical complication of implanted electronic neurostimulator (electrode) of spinal cord, initial encounter: Secondary | ICD-10-CM | POA: Insufficient documentation

## 2023-09-27 DIAGNOSIS — K7682 Hepatic encephalopathy: Secondary | ICD-10-CM | POA: Insufficient documentation

## 2023-09-27 DIAGNOSIS — K766 Portal hypertension: Secondary | ICD-10-CM | POA: Diagnosis not present

## 2023-09-27 DIAGNOSIS — Z01812 Encounter for preprocedural laboratory examination: Secondary | ICD-10-CM | POA: Diagnosis present

## 2023-09-27 DIAGNOSIS — Z01818 Encounter for other preprocedural examination: Secondary | ICD-10-CM | POA: Diagnosis not present

## 2023-09-27 DIAGNOSIS — D696 Thrombocytopenia, unspecified: Secondary | ICD-10-CM | POA: Insufficient documentation

## 2023-09-27 DIAGNOSIS — Z6841 Body Mass Index (BMI) 40.0 and over, adult: Secondary | ICD-10-CM | POA: Diagnosis not present

## 2023-09-27 DIAGNOSIS — Z01810 Encounter for preprocedural cardiovascular examination: Secondary | ICD-10-CM | POA: Diagnosis present

## 2023-09-27 HISTORY — DX: Unspecified cirrhosis of liver: K74.60

## 2023-09-27 HISTORY — DX: Lobulated, fused and horseshoe kidney: Q63.1

## 2023-09-27 LAB — COMPREHENSIVE METABOLIC PANEL WITH GFR
ALT: 20 U/L (ref 0–44)
AST: 22 U/L (ref 15–41)
Albumin: 3.6 g/dL (ref 3.5–5.0)
Alkaline Phosphatase: 54 U/L (ref 38–126)
Anion gap: 7 (ref 5–15)
BUN: 17 mg/dL (ref 8–23)
CO2: 20 mmol/L — ABNORMAL LOW (ref 22–32)
Calcium: 9.4 mg/dL (ref 8.9–10.3)
Chloride: 110 mmol/L (ref 98–111)
Creatinine, Ser: 1.02 mg/dL (ref 0.61–1.24)
GFR, Estimated: >60 mL/min (ref >60)
Glucose, Bld: 100 mg/dL — ABNORMAL HIGH (ref 70–99)
Potassium: 4.3 mmol/L (ref 3.5–5.1)
Sodium: 137 mmol/L (ref 135–145)
Total Bilirubin: 0.8 mg/dL (ref 0.0–1.2)
Total Protein: 6.7 g/dL (ref 6.5–8.1)

## 2023-09-27 LAB — HEMOGLOBIN A1C
Hgb A1c MFr Bld: 5 % (ref 4.8–5.6)
Mean Plasma Glucose: 96.8 mg/dL

## 2023-09-27 LAB — GLUCOSE, CAPILLARY: Glucose-Capillary: 98 mg/dL (ref 70–99)

## 2023-09-27 NOTE — Progress Notes (Signed)
 PCP - Dr. Miquel Blunt Cardiologist - denies  PPM/ICD - denies Device Orders - na Rep Notified - na  Chest x-ray - na EKG - PAT, 09/27/2023 Stress Test - 2011 ECHO - 06/20/2015 Cardiac Cath -   Sleep Study - Sleep apnea CPAP - uses nightly  Type II diabetes. A1C drawn at PAT appointment. Blood sugar 98 at PAT appointment  Fasting Blood Sugar - 70-130 Checks Blood Sugar: 2- 3 times/week  Last dose of GLP1 agonist-  Mounjaro GLP1 instructions: hold 7 days, last dose no later than 09/23/2023; states last dose 09/18/2023  Blood Thinner Instructions:denies Aspirin  Instructions:denies  ERAS Protcol -NPO  Anesthesia review: Yes. CVA, HTN, DM, OSA with CPAP, A-fib, HLD, thrombocytopenia  Patient denies shortness of breath, fever, cough and chest pain at PAT appointment   All instructions explained to the patient, with a verbal understanding of the material. Patient agrees to go over the instructions while at home for a better understanding. Patient also instructed to self quarantine after being tested for COVID-19. The opportunity to ask questions was provided.

## 2023-09-30 ENCOUNTER — Encounter (HOSPITAL_COMMUNITY): Payer: Self-pay

## 2023-09-30 NOTE — Progress Notes (Addendum)
 Anesthesia Chart Review:  Case: [id number] Date/Time: 10/01/23 1008   Procedure: INSERTION, SPINAL CORD STIMULATOR, LUMBAR - SPINAL CORD STIMULATOR REMOVAL AND REPLACEMENT, LUMBAR   Anesthesia type: General   Diagnosis: Malfunction of spinal cord stimulator, initial encounter (HCC) [T85.192A]   Pre-op diagnosis: Malfuntion of spinal cord stimulator, initial encounter   [facility] ROOM 20 / [facility] OR   Surgeons: Colon Shove, MD       DISCUSSION: Patient is a 68 year old male scheduled for the above procedure. By neurosurgery notes, he has a spinal cord stimulator which is no longer functioning properly due to damage to the device.   History includes never smoker, HTN, hypercholesterolemia, DM2, afib (~ 2012 episode), OSA (uses CPAP), congenital horseshoe kidney, MASH cirrhosis (diagnosed ~ 2022), pancytopenia/thrombocytopenia, osteoarthritis (left TSA 08/16/2016), Bell's palsy (left 06/2015), spinal surgery (L4-5 laminectomy/discectomy 05/15/2013, redo 06/18/2014; spinal cord stimulator ~ 2018). BMI is consistent with morbid obesity.  He has known non-alcoholic liver cirrhosis with hepatosplenomegaly, with pancytopenia, portal hypertension, esophageal varices (s/p banding 09/2021; 11/2021, 12/2021), and hepatic encephalopathy. He is followed by the [facility] and [facility].   - Last [facility] hepatology follow-up with Dr. Edith was on 08/15/2023. Liver disease felt most likely secondary to metabolic dysfunction associated steatotic liver disease (MASLD/MASH).  CT cirrhosis protocol on 08/15/2023 showed no suspicious hepatic lesions and cirrhotic liver morphology with sequela of portal hypertension including splenomegaly and a recannulized umbilical vein. At visit she wrote, Clinically, Brandon Pena has remained relatively stable.  He has no evidence of ascites or significant lower extremity edema and is not currently on diuretic therapy.  He has a reported history of hepatic encephalopathy and remains  on lactulose /zinc therapy, currently having goal number of bowel movements per day.    He denies a history of portal hypertensive bleeding, but certainly has varices that have necessitated banding on multiple occasions.  He is now on goal dose carvedilol  therapy.    In terms of his labs, his liver tests are currently in the normal range.  His albumin  is normal and INR is 1.2, overall suggestive of relatively preserved synthetic function of the liver.  His platelets are low consistent with clinically significant portal hypertension.SABRASABRASABRAOverall, my impression is that Brandon Pena has at least compensated cirrhosis with clinically significant portal hypertension.  If he does indeed have a true history of hepatic encephalopathy, then he meets criteria for decompensated cirrhosis with hepatic encephalopathy being his decompensating event.... Based on his clinical stability and his MELD score of 10, we discussed that my impression is that we continue to closely monitor him over time and monitor his MELD score... Six month follow-up planned.     - Last [facility] follow-up was on 09/11/2023 with Nunzio Folks, NP.  Pancytopenia felt like multifactorial, but main reason likely due to his known liver disease and hepatosplenomegaly. He received IV Venofer x2 in 2022. Last EGD 08/2022 showed improved from before but not amenable to banding. Portal hypertensive gastropathy.  1 year recall recommended.  This is currently scheduled for 10/07/2023. Next colonoscopy is due ~ 09/2024. CBC at 09/11/2023 visit showed PLT count 35K, consistent with prior results.WBC 2.0, H/H 13.2/41.1, AST 27, ALT 31. Continue monitoring of leukopenia and thrombocytopenia with next labs ~ 4 weeks with office visit and labs in ~ 8 weeks.    PLT count trends (Care Everywhere): 35K 09/11/2023 49K 08/19/2023 35K 08/13/2023 40K 08/08/2023 60K 04/10/2023 39K 02/25/2023 43K 12/09/2022 39K 11/01/2022 49K 10/15/2022  PT/INR on 08/15/2023  was 13.5/1.2  ([facility]). LFTs normal 09/27/2023.   09/27/2023 A1c was 5.0%. BMI 43.24. Last Mounjaro 09/18/2023.  Known chronic thrombocytopenia, nearly all of his PLT counts have been < 50K over the past year.  He has had recent HEM and hepatology follow-up. Cirrhosis felt compensated. PLT count results called to Katie at Dr. Thayer office, and she will have him review. I also notified anesthesiologist Niels Pons, MD, and will await input from Dr. Luanna. Update: Katie confirmed Dr. Colon was notified of platelet count and was okay proceeding with the planned procedure.  Since his last CBC will be nearly 3 weeks ago, I did order an updated CBC on arrival for surgery.  T&S tentatively ordered given the severity of his thrombocytopenia. Currently case is scheduled for nearly 3 hours.    VS: BP 128/64   Pulse 65   Temp 36.6 C   Resp 18   Ht 5' 11 (1.803 m)   Wt (!) 140.6 kg   SpO2 99%   BMI 43.24 kg/m    PROVIDERS: Marsa Miquel Faden, MD is PCP  Edith, April, MD is GI ([facility]) Janis Dunnings, MD is HEM [place])   LABS:  CBC on 09/11/2023 ([facility]): WBC 5.1 - 10.8 thou/mcL 2.0 Low    RBC 4.05 - 5.64 million/mcL 4.70   HGB 13.5 - 17.5 gm/dL 86.7 Low    HCT 59.4 - 52.5 % 41.1   MCV 83.0 - 97.0 fL 87.4   MCH 28.0 - 33.0 pg 28.1   MCHC 32.0 - 36.0 gm/dL 67.8   Plt Ct 849 - 599 thou/mcL 35 Low  Known Cirrohosis, thrombocytopenia.  Results consistent with previous. No manual platelet per Dr. Janis.  RDW SD 36.0 - 47.0 fL 47.5 High    RDW CV 11.7 - 15.2 % 14.6   MPV 8.9 - 11.0 fL 11.1 High    NEUTROPHIL % % 70.6   LYMPHOCYTE % % 12.7   MONOCYTE % % 9.3   Eosinophil % % 6.4   BASOPHIL % % 0.5   IG% 0.0 - 0.4 % 0.5 High    ABSOLUTE NEUTROPHIL COUNT 1.50 - 7.50 thou/mcL 1.44 Low    ABSOLUTE LYMPHOCYTE COUNT 1.00 - 4.50 thou/mcL 0.26 Low    Absolute Monocyte Count 0.10 - 0.80 thou/mcL 0.19   Absolute Eosinophil Count 0.00 - 0.50 thou/mcL 0.13    Absolute Basophil Count 0.00 - 0.20 thou/mcL 0.01   Absolute Immature Granulocyte Count 0.00 - 0.03 thou/mcL 0.01    CMP 09/27/2023 ([facility]): Labs Reviewed  COMPREHENSIVE METABOLIC PANEL WITH GFR - Abnormal; Notable for the following components:      Result Value   CO2 20 (*)    Glucose, Bld 100 (*)    All other components within normal limits  GLUCOSE, CAPILLARY  HEMOGLOBIN A1C    OTHER: EGD 08/16/2022 ([facility]): Impression:  Multiple medium varices in the lower third of the esophagus  Mild and mosaic portal hypertensive gastropathy in the fundus of the  stomach and body of the stomach  The duodenal bulb and 2nd part of the duodenum appeared normal.   Recommendations:  - Will increase Coreg  to 12.5mg  daily. HR prior to procedure not at goal.  - Repeat EGD in 1 year.  - Follow up in clinic as scheduled.    IMAGES: CT Abd Cirrhosis protocol 08/15/2023 ([facility]): Impression:  Hepatic findings:  1.  No suspicious hepatic lesion.  2.  Cirrhotic liver morphology with sequela of  portal hypertension including splenomegaly and a recannulized umbilical vein.  Extrahepatic findings:  1.  No acute findings.   CT Abd/pelvis 08/13/2023 ([facility]): IMPRESSION:  1. Cirrhosis with evidence of portal venous hypertension including splenomegaly and umbilical varices.  2.  Stranding in the retroperitoneum adjacent to the celiac artery and uncinate process may represent inflammation, possibly from pancreatitis, however this appears similar to the prior exam and may also represent sequela of retroperitoneal inflammation or underlying portal hypertension.  3.  Horseshoe kidney. No hydronephrosis.    EKG: 09/27/2023: Normal sinus rhythm Normal ECG When compared with ECG of 13-Aug-2016 08:41, noise artifact is now present Confirmed by Waddell Lusher [phone]) on 09/28/2023 8:53:22 AM   CV: Echo 07/20/2020 ([facility]): Normal left ventricular chamber size with mild concentric LVH and   normal systolic wall motion.    Preserved LVEF 60-65%.    Normal diastolic function.    Aortic valve sclerosis without stenosis.    Pulmonary artery systolic pressure cannot be determined.    Compared to prior echocardiogram report dated 06/29/2015: There are no  significant changes noted.   US  Carotid 03/04/2017 ([facility]): Results not viewable in Care Everywhere.  Dobutamine stress echo 06/09/2009 [facility]; scanned under Media tab, Correspondence 06/18/14): Normal resting wall motion and no stress-induced wall motion abnormality. Normal LVF at rest. No wall motion abnormalities noted at adequate work load.       Past Medical History:  Diagnosis Date   Arthritis    osteoarthritis   Bell's palsy    Cirrhosis (HCC)    likely related to MASLD/MASH   Dysrhythmia    GERD (gastroesophageal reflux disease)    Headache    History of atrial fibrillation    reports one time issue in 2012   History of kidney stones    Horseshoe kidney    Hypercholesteremia    Hypertension    Does not see a cardiologist   Sleep apnea    uses CPAP, last sleep study done 8-81yrs. ago   Thrombocytopenia (HCC)    Type II diabetes mellitus (HCC)    Type 2   Wears glasses     Past Surgical History:  Procedure Laterality Date   ADENOIDECTOMY     BICEPS TENDON REPAIR Right 1990's   CIRCUMCISION  1985   COLONOSCOPY W/ POLYPECTOMY     KNEE ARTHROSCOPY Left 1980's   LUMBAR LAMINECTOMY/DECOMPRESSION MICRODISCECTOMY Left 05/15/2013   Procedure: LUMBAR LAMINECTOMY/DECOMPRESSION MICRODISCECTOMY 1 LEVEL;  Surgeon: Arley SHAUNNA Helling, MD;  Location: [facility];  Service: Neurosurgery;  Laterality: Left;  LUMBAR LAMINECTOMY/DECOMPRESSION MICRODISCECTOMY 1 LEVEL L4-5   LUMBAR LAMINECTOMY/DECOMPRESSION MICRODISCECTOMY Left 06/18/2014   Procedure: Microdiscectomy - L4-L5 - left redo;  Surgeon: Arley Helling, MD;  Location: [facility];  Service: Neurosurgery;  Laterality: Left;   ORIF PATELLA FRACTURE Left 1980's    SPINAL CORD STIMULATOR INSERTION     TONSILLECTOMY     TOTAL SHOULDER ARTHROPLASTY Left 08/16/2016   Procedure: TOTAL SHOULDER ARTHROPLASTY;  Surgeon: Dozier Soulier, MD;  Location: [facility] OR;  Service: Orthopedics;  Laterality: Left;  Left shoulder arthroplasty    MEDICATIONS:  carvedilol  (COREG ) 6.25 MG tablet   Cholecalciferol  (VITAMIN D3 PO)   ferrous sulfate 325 (65 FE) MG tablet   finasteride  (PROSCAR ) 5 MG tablet   folic acid (FOLVITE) 1 MG tablet   lactulose  (CHRONULAC ) 10 GM/15ML solution   Magnesium  250 MG TABS   MOUNJARO 2.5 MG/0.5ML Pen   nitrofurantoin , macrocrystal-monohydrate, (MACROBID ) 100 MG capsule   omeprazole (  PRILOSEC) 40 MG capsule   oxyCODONE -acetaminophen  (PERCOCET) 10-325 MG tablet   potassium chloride  (MICRO-K ) 10 MEQ CR capsule   Potassium Citrate  15 MEQ (1620 MG) TBCR   pravastatin  (PRAVACHOL ) 40 MG tablet   sucralfate  (CARAFATE ) 1 g tablet   tamsulosin  (FLOMAX ) 0.4 MG CAPS capsule   traZODone  (DESYREL ) 50 MG tablet   TURMERIC PO   Zinc-Vitamin C (ZINC-A-COLD/VITAMIN C MT)   No current facility-administered medications for this encounter.    Isaiah Ruder, PA-C Surgical Short Stay/Anesthesiology [facility] Phone [phone] [facility] Phone [phone] 09/30/2023 6:13 PM

## 2023-09-30 NOTE — Anesthesia Preprocedure Evaluation (Signed)
 Anesthesia Evaluation  Patient identified by MRN, date of birth, ID band Patient awake    Reviewed: Allergy & Precautions, H&P , NPO status , Patient's Chart, lab work & pertinent test results  History of Anesthesia Complications Negative for: history of anesthetic complications  Airway Mallampati: II  TM Distance: >3 FB Neck ROM: Full    Dental no notable dental hx.    Pulmonary sleep apnea and Continuous Positive Airway Pressure Ventilation    Pulmonary exam normal breath sounds clear to auscultation       Cardiovascular hypertension, Normal cardiovascular exam+ dysrhythmias Atrial Fibrillation  Rhythm:Regular Rate:Normal     Neuro/Psych  Headaches Hx of bells palsy  Neuromuscular disease  negative psych ROS   GI/Hepatic ,GERD  ,,(+) Cirrhosis         Endo/Other  negative endocrine ROSdiabetes, Type 2    Renal/GU negative Renal ROS  negative genitourinary   Musculoskeletal  (+) Arthritis ,    Abdominal   Peds negative pediatric ROS (+)  Hematology negative hematology ROS (+) Chronic thrombocytopenia    Anesthesia Other Findings   Reproductive/Obstetrics negative OB ROS                              Anesthesia Physical Anesthesia Plan  ASA: 4  Anesthesia Plan: General   Post-op Pain Management:    Induction:   PONV Risk Score and Plan: 2 and Ondansetron , Dexamethasone  and Treatment may vary due to age or medical condition  Airway Management Planned: Oral ETT  Additional Equipment: None  Intra-op Plan:   Post-operative Plan: Extubation in OR  Informed Consent: I have reviewed the patients History and Physical, chart, labs and discussed the procedure including the risks, benefits and alternatives for the proposed anesthesia with the patient or authorized representative who has indicated his/her understanding and acceptance.     Dental advisory given  Plan Discussed  with: CRNA  Anesthesia Plan Comments: (PAT note written 09/30/2023 by Oriana Horiuchi, PA-C. Known thrombocytopenia with PLT count mostly < 50K for at least the past year. INR 1.2. Recent follow-up with HEM and Hepatology. Dr. Colon was notified of last PLT count. Last CBC will be nearly 77 weeks old, so updated CBC for day of surgery ordered. Unless decision to discontinue by anesthesiologist, I also ordered a T&S with day of surgery labs since PLT count has been < 50K.    Patient is a 68 year old male scheduled for the above procedure. By neurosurgery notes, he has a spinal cord stimulator which is no longer functioning properly due to damage to the device.    History includes never smoker, HTN, hypercholesterolemia, DM2, afib (~ 2012 episode), OSA (uses CPAP), congenital horseshoe kidney, MASH cirrhosis (diagnosed ~ 2022), pancytopenia/thrombocytopenia, osteoarthritis (left TSA 08/16/2016), Bell's palsy (left 06/2015), spinal surgery (L4-5 laminectomy/discectomy 05/15/2013, redo 06/18/2014; spinal cord stimulator ~ 2018). BMI is consistent with morbid obesity.   He has known non-alcoholic liver cirrhosis with hepatosplenomegaly, with pancytopenia, portal hypertension, esophageal varices (s/p banding 09/2021; 11/2021, 12/2021), and hepatic encephalopathy. He is followed by the [facility] and [facility].    - Last [facility] hepatology follow-up with Dr. Edith was on 08/15/2023. Liver disease felt most likely secondary to metabolic dysfunction associated steatotic liver disease (MASLD/MASH).  CT cirrhosis protocol on 08/15/2023 showed no suspicious hepatic lesions and cirrhotic liver morphology with sequela of portal hypertension including splenomegaly and a recannulized umbilical vein. At visit she wrote, Clinically,  Mr. Vonbargen has remained relatively stable.  He has no evidence of ascites or significant lower extremity edema and is not currently on diuretic therapy.  He has a reported history of hepatic  encephalopathy and remains on lactulose /zinc therapy, currently having goal number of bowel movements per day.    He denies a history of portal hypertensive bleeding, but certainly has varices that have necessitated banding on multiple occasions.  He is now on goal dose carvedilol  therapy.    In terms of his labs, his liver tests are currently in the normal range.  His albumin  is normal and INR is 1.2, overall suggestive of relatively preserved synthetic function of the liver.  His platelets are low consistent with clinically significant portal hypertension.SABRASABRASABRAOverall, my impression is that Mr. Kiedrowski has at least compensated cirrhosis with clinically significant portal hypertension.  If he does indeed have a true history of hepatic encephalopathy, then he meets criteria for decompensated cirrhosis with hepatic encephalopathy being his decompensating event.... Based on his clinical stability and his MELD score of 10, we discussed that my impression is that we continue to closely monitor him over time and monitor his MELD score... Six month follow-up planned.      - Last [facility]ology follow-up was on 09/11/2023 with Nunzio Folks, NP.  Pancytopenia felt like multifactorial, but main reason likely due to his known liver disease and hepatosplenomegaly. He received IV Venofer x2 in 2022. Last EGD 08/2022 showed improved from before but not amenable to banding. Portal hypertensive gastropathy.  1 year recall recommended.  This is currently scheduled for 10/07/2023. Next colonoscopy is due ~ 09/2024. CBC at 09/11/2023 visit showed PLT count 35K, consistent with prior results.WBC 2.0, H/H 13.2/41.1, AST 27, ALT 31. Continue monitoring of leukopenia and thrombocytopenia with next labs ~ 4 weeks with office visit and labs in ~ 8 weeks.      PLT count trends (Care Everywhere): 35K 09/11/2023 49K 08/19/2023 35K 08/13/2023 40K 08/08/2023 60K 04/10/2023 39K 02/25/2023 43K 12/09/2022 39K 11/01/2022 49K 10/15/2022    PT/INR on 08/15/2023 was 13.5/1.2 ([facility]). LFTs normal 09/27/2023.    09/27/2023 A1c was 5.0%. BMI 43.24. Last Mounjaro 09/18/2023.   Known chronic thrombocytopenia, nearly all of his PLT counts have been < 50K over the past year.  He has had recent HEM and hepatology follow-up. Cirrhosis felt compensated. PLT count results called to Katie at Dr. Thayer office, and she will have him review. I also notified anesthesiologist Niels Pons, MD, and will await input from Dr. Luanna. Update: Katie confirmed Dr. Colon was notified of platelet count and was okay proceeding with the planned procedure.  Since his last CBC will be nearly 3 weeks ago, I did order an updated CBC on arrival for surgery.  T&S tentatively ordered given the severity of his thrombocytopenia. Currently case is scheduled for nearly 3 hours. )         Anesthesia Quick Evaluation

## 2023-10-01 ENCOUNTER — Encounter (HOSPITAL_COMMUNITY)
Admission: RE | Disposition: A | Discharge status: Home / Self Care | Payer: Self-pay | Source: Home / Self Care | Attending: Neurological Surgery

## 2023-10-01 ENCOUNTER — Other Ambulatory Visit: Payer: Self-pay

## 2023-10-01 ENCOUNTER — Ambulatory Visit (HOSPITAL_COMMUNITY)

## 2023-10-01 ENCOUNTER — Observation Stay (HOSPITAL_COMMUNITY)
Admission: RE | Admit: 2023-10-01 | Discharge: 2023-10-02 | Disposition: A | Discharge status: Home / Self Care | Payer: Self-pay | Attending: Neurological Surgery | Admitting: Neurological Surgery

## 2023-10-01 ENCOUNTER — Ambulatory Visit (HOSPITAL_COMMUNITY): Payer: Self-pay | Admitting: Vascular Surgery

## 2023-10-01 ENCOUNTER — Ambulatory Visit (HOSPITAL_BASED_OUTPATIENT_CLINIC_OR_DEPARTMENT_OTHER): Admitting: Certified Registered Nurse Anesthetist

## 2023-10-01 ENCOUNTER — Encounter (HOSPITAL_COMMUNITY): Payer: Self-pay | Admitting: Neurological Surgery

## 2023-10-01 DIAGNOSIS — I4891 Unspecified atrial fibrillation: Secondary | ICD-10-CM

## 2023-10-01 DIAGNOSIS — E119 Type 2 diabetes mellitus without complications: Secondary | ICD-10-CM | POA: Insufficient documentation

## 2023-10-01 DIAGNOSIS — T85840A Pain due to nervous system prosthetic devices, implants and grafts, initial encounter: Secondary | ICD-10-CM | POA: Diagnosis not present

## 2023-10-01 DIAGNOSIS — Z6841 Body Mass Index (BMI) 40.0 and over, adult: Secondary | ICD-10-CM | POA: Diagnosis not present

## 2023-10-01 DIAGNOSIS — Z01818 Encounter for other preprocedural examination: Secondary | ICD-10-CM

## 2023-10-01 DIAGNOSIS — T85112A Breakdown (mechanical) of implanted electronic neurostimulator (electrode) of spinal cord, initial encounter: Secondary | ICD-10-CM | POA: Diagnosis not present

## 2023-10-01 DIAGNOSIS — M5459 Other low back pain: Secondary | ICD-10-CM | POA: Diagnosis present

## 2023-10-01 DIAGNOSIS — K7460 Unspecified cirrhosis of liver: Secondary | ICD-10-CM

## 2023-10-01 DIAGNOSIS — D696 Thrombocytopenia, unspecified: Secondary | ICD-10-CM | POA: Diagnosis not present

## 2023-10-01 DIAGNOSIS — I10 Essential (primary) hypertension: Secondary | ICD-10-CM | POA: Insufficient documentation

## 2023-10-01 DIAGNOSIS — T85192A Other mechanical complication of implanted electronic neurostimulator (electrode) of spinal cord, initial encounter: Principal | ICD-10-CM | POA: Diagnosis present

## 2023-10-01 HISTORY — PX: SPINAL CORD STIMULATOR INSERTION: SHX5378

## 2023-10-01 LAB — GLUCOSE, CAPILLARY
Glucose-Capillary: 96 mg/dL (ref 70–99)
Glucose-Capillary: 100 mg/dL — ABNORMAL HIGH (ref 70–99)
Glucose-Capillary: 138 mg/dL — ABNORMAL HIGH (ref 70–99)
Glucose-Capillary: 163 mg/dL — ABNORMAL HIGH (ref 70–99)
Glucose-Capillary: 147 mg/dL — ABNORMAL HIGH (ref 70–99)

## 2023-10-01 LAB — CBC
HCT: 37.4 % — ABNORMAL LOW (ref 39.0–52.0)
Hemoglobin: 12.3 g/dL — ABNORMAL LOW (ref 13.0–17.0)
MCH: 28.3 pg (ref 26.0–34.0)
MCHC: 32.9 g/dL (ref 30.0–36.0)
MCV: 86.2 fL (ref 80.0–100.0)
Platelets: 37 K/uL — ABNORMAL LOW (ref 150–400)
RBC: 4.34 MIL/uL (ref 4.22–5.81)
RDW: 14.9 % (ref 11.5–15.5)
WBC: 2.4 K/uL — ABNORMAL LOW (ref 4.0–10.5)
nRBC: 0 % (ref 0.0–0.2)

## 2023-10-01 LAB — PLATELET COUNT: Platelets: 43 K/uL — ABNORMAL LOW (ref 150–400)

## 2023-10-01 LAB — TYPE AND SCREEN
ABO/RH(D): A POS
Antibody Screen: NEGATIVE

## 2023-10-01 SURGERY — INSERTION, SPINAL CORD STIMULATOR, LUMBAR
Anesthesia: General | Site: Back

## 2023-10-01 MED ORDER — ALBUMIN HUMAN 5 % IV SOLN: INTRAVENOUS | Status: DC | PRN

## 2023-10-01 MED ORDER — 0.9 % SODIUM CHLORIDE (POUR BTL) OPTIME
TOPICAL | Status: DC | PRN
Start: 2023-10-01 — End: 2023-10-01
  Administered 2023-10-01: 1000 mL

## 2023-10-01 MED ORDER — ROCURONIUM BROMIDE 10 MG/ML (PF) SYRINGE
PREFILLED_SYRINGE | INTRAVENOUS | Status: AC
  Filled 2023-10-01: qty 10

## 2023-10-01 MED ORDER — DEXAMETHASONE SODIUM PHOSPHATE 10 MG/ML IJ SOLN
INTRAMUSCULAR | Status: DC | PRN
  Administered 2023-10-01: 10 mg via INTRAVENOUS

## 2023-10-01 MED ORDER — ONDANSETRON HCL 4 MG/2ML IJ SOLN
INTRAMUSCULAR | Status: DC | PRN
Start: 2023-10-01 — End: 2023-10-01
  Administered 2023-10-01: 4 mg via INTRAVENOUS

## 2023-10-01 MED ORDER — CHLORHEXIDINE GLUCONATE CLOTH 2 % EX PADS: 6.0000 | MEDICATED_PAD | Freq: Once | CUTANEOUS | Status: DC

## 2023-10-01 MED ORDER — ACETAMINOPHEN 325 MG PO TABS
325.0000 mg | ORAL_TABLET | ORAL | Status: DC | PRN
  Administered 2023-10-01 – 2023-10-02 (×4): 325 mg via ORAL
  Filled 2023-10-01 (×4): qty 1

## 2023-10-01 MED ORDER — LACTATED RINGERS IV SOLN: INTRAVENOUS | Status: DC

## 2023-10-01 MED ORDER — TRAZODONE HCL 50 MG PO TABS
50.0000 mg | ORAL_TABLET | Freq: Every day | ORAL | Status: DC
  Administered 2023-10-01: 50 mg via ORAL
  Filled 2023-10-01: qty 1

## 2023-10-01 MED ORDER — BUPIVACAINE HCL (PF) 0.5 % IJ SOLN
INTRAMUSCULAR | Status: AC
  Filled 2023-10-01: qty 30

## 2023-10-01 MED ORDER — OXYCODONE HCL 5 MG PO TABS
10.0000 mg | ORAL_TABLET | ORAL | Status: DC | PRN
Start: 2023-10-01 — End: 2023-10-02
  Administered 2023-10-01 – 2023-10-02 (×4): 10 mg via ORAL
  Filled 2023-10-01 (×5): qty 2

## 2023-10-01 MED ORDER — SUGAMMADEX SODIUM 200 MG/2ML IV SOLN
INTRAVENOUS | Status: DC | PRN
  Administered 2023-10-01: 300 mg via INTRAVENOUS

## 2023-10-01 MED ORDER — PHENYLEPHRINE 80 MCG/ML (10ML) SYRINGE FOR IV PUSH (FOR BLOOD PRESSURE SUPPORT)
PREFILLED_SYRINGE | INTRAVENOUS | Status: AC
  Filled 2023-10-01: qty 10

## 2023-10-01 MED ORDER — PROPOFOL 10 MG/ML IV BOLUS
INTRAVENOUS | Status: DC | PRN
  Administered 2023-10-01: 200 mg via INTRAVENOUS

## 2023-10-01 MED ORDER — FENTANYL CITRATE (PF) 250 MCG/5ML IJ SOLN
INTRAMUSCULAR | Status: AC
  Filled 2023-10-01: qty 5

## 2023-10-01 MED ORDER — ACETAMINOPHEN 650 MG RE SUPP: 650.0000 mg | RECTAL | Status: DC | PRN

## 2023-10-01 MED ORDER — METHOCARBAMOL 1000 MG/10ML IJ SOLN
INTRAMUSCULAR | Status: AC
  Filled 2023-10-01: qty 10

## 2023-10-01 MED ORDER — FENTANYL CITRATE (PF) 100 MCG/2ML IJ SOLN
25.0000 ug | INTRAMUSCULAR | Status: DC | PRN
  Administered 2023-10-01: 25 ug via INTRAVENOUS

## 2023-10-01 MED ORDER — PHENYLEPHRINE HCL (PRESSORS) 10 MG/ML IV SOLN
INTRAVENOUS | Status: AC
  Filled 2023-10-01: qty 1

## 2023-10-01 MED ORDER — PHENYLEPHRINE 80 MCG/ML (10ML) SYRINGE FOR IV PUSH (FOR BLOOD PRESSURE SUPPORT)
PREFILLED_SYRINGE | INTRAVENOUS | Status: DC | PRN
Start: 2023-10-01 — End: 2023-10-01
  Administered 2023-10-01: 160 ug via INTRAVENOUS
  Administered 2023-10-01: 80 ug via INTRAVENOUS

## 2023-10-01 MED ORDER — FINASTERIDE 5 MG PO TABS
5.0000 mg | ORAL_TABLET | Freq: Every evening | ORAL | Status: DC
  Administered 2023-10-01: 5 mg via ORAL
  Filled 2023-10-01: qty 1

## 2023-10-01 MED ORDER — OXYCODONE HCL 5 MG PO TABS: 5.0000 mg | ORAL_TABLET | Freq: Once | ORAL | Status: DC | PRN

## 2023-10-01 MED ORDER — POTASSIUM CHLORIDE CRYS ER 10 MEQ PO TBCR
10.0000 meq | EXTENDED_RELEASE_TABLET | Freq: Two times a day (BID) | ORAL | Status: DC
  Administered 2023-10-01 – 2023-10-02 (×2): 10 meq via ORAL
  Filled 2023-10-01 (×2): qty 1

## 2023-10-01 MED ORDER — SENNA 8.6 MG PO TABS
1.0000 | ORAL_TABLET | Freq: Two times a day (BID) | ORAL | Status: DC
  Administered 2023-10-01 – 2023-10-02 (×2): 8.6 mg via ORAL
  Filled 2023-10-01 (×2): qty 1

## 2023-10-01 MED ORDER — DROPERIDOL 2.5 MG/ML IJ SOLN: 0.6250 mg | Freq: Once | INTRAMUSCULAR | Status: DC | PRN

## 2023-10-01 MED ORDER — ACETAMINOPHEN 325 MG PO TABS: 650.0000 mg | ORAL_TABLET | ORAL | Status: DC | PRN

## 2023-10-01 MED ORDER — BUPIVACAINE HCL (PF) 0.5 % IJ SOLN
INTRAMUSCULAR | Status: DC | PRN
  Administered 2023-10-01: 7 mL
  Administered 2023-10-01: 15 mL

## 2023-10-01 MED ORDER — METHOCARBAMOL 500 MG PO TABS
500.0000 mg | ORAL_TABLET | Freq: Four times a day (QID) | ORAL | Status: DC | PRN
  Administered 2023-10-01 – 2023-10-02 (×3): 500 mg via ORAL
  Filled 2023-10-01 (×3): qty 1

## 2023-10-01 MED ORDER — LIDOCAINE-EPINEPHRINE 1 %-1:100000 IJ SOLN
INTRAMUSCULAR | Status: DC | PRN
  Administered 2023-10-01: 10 mL
  Administered 2023-10-01: 5 mL

## 2023-10-01 MED ORDER — FENTANYL CITRATE (PF) 100 MCG/2ML IJ SOLN
INTRAMUSCULAR | Status: AC
  Filled 2023-10-01: qty 2

## 2023-10-01 MED ORDER — SODIUM CHLORIDE 0.9% FLUSH: 3.0000 mL | INTRAVENOUS | Status: DC | PRN

## 2023-10-01 MED ORDER — SODIUM CHLORIDE 0.9% FLUSH
3.0000 mL | Freq: Two times a day (BID) | INTRAVENOUS | Status: DC
  Administered 2023-10-01: 3 mL via INTRAVENOUS

## 2023-10-01 MED ORDER — MAGNESIUM 250 MG PO TABS: 250.0000 mg | ORAL_TABLET | Freq: Every morning | ORAL | Status: DC

## 2023-10-01 MED ORDER — ACETAMINOPHEN 10 MG/ML IV SOLN
1000.0000 mg | Freq: Once | INTRAVENOUS | Status: DC | PRN
  Administered 2023-10-01: 1000 mg via INTRAVENOUS

## 2023-10-01 MED ORDER — CARVEDILOL 6.25 MG PO TABS
6.2500 mg | ORAL_TABLET | Freq: Two times a day (BID) | ORAL | Status: DC
  Administered 2023-10-01: 6.25 mg via ORAL
  Filled 2023-10-01: qty 1

## 2023-10-01 MED ORDER — CEFAZOLIN SODIUM-DEXTROSE 2-4 GM/100ML-% IV SOLN
2.0000 g | Freq: Three times a day (TID) | INTRAVENOUS | Status: AC
  Administered 2023-10-01 – 2023-10-02 (×2): 2 g via INTRAVENOUS
  Filled 2023-10-01 (×2): qty 100

## 2023-10-01 MED ORDER — PHENYLEPHRINE HCL (PRESSORS) 10 MG/ML IV SOLN
INTRAVENOUS | Status: AC
Start: 2023-10-01 — End: 2023-10-01
  Filled 2023-10-01: qty 1

## 2023-10-01 MED ORDER — THROMBIN 5000 UNITS EX KIT
PACK | CUTANEOUS | Status: AC
  Filled 2023-10-01: qty 1

## 2023-10-01 MED ORDER — CEFAZOLIN SODIUM-DEXTROSE 3-4 GM/150ML-% IV SOLN
3.0000 g | INTRAVENOUS | Status: AC
  Administered 2023-10-01: 3 g via INTRAVENOUS
  Filled 2023-10-01: qty 150

## 2023-10-01 MED ORDER — DEXAMETHASONE SODIUM PHOSPHATE 10 MG/ML IJ SOLN
INTRAMUSCULAR | Status: AC
  Filled 2023-10-01: qty 1

## 2023-10-01 MED ORDER — POLYETHYLENE GLYCOL 3350 17 G PO PACK: 17.0000 g | PACK | Freq: Every day | ORAL | Status: DC | PRN

## 2023-10-01 MED ORDER — SUCRALFATE 1 G PO TABS
2.0000 g | ORAL_TABLET | Freq: Two times a day (BID) | ORAL | Status: DC
  Administered 2023-10-01 – 2023-10-02 (×2): 2 g via ORAL
  Filled 2023-10-01 (×3): qty 2

## 2023-10-01 MED ORDER — OXYCODONE-ACETAMINOPHEN 10-325 MG PO TABS: 1.0000 | ORAL_TABLET | ORAL | Status: DC | PRN

## 2023-10-01 MED ORDER — METHOCARBAMOL 1000 MG/10ML IJ SOLN
500.0000 mg | Freq: Four times a day (QID) | INTRAMUSCULAR | Status: DC | PRN
  Administered 2023-10-01: 500 mg via INTRAVENOUS
  Filled 2023-10-01: qty 5

## 2023-10-01 MED ORDER — LIDOCAINE 2% (20 MG/ML) 5 ML SYRINGE
INTRAMUSCULAR | Status: AC
  Filled 2023-10-01: qty 5

## 2023-10-01 MED ORDER — MENTHOL 3 MG MT LOZG: 1.0000 | LOZENGE | OROMUCOSAL | Status: DC | PRN

## 2023-10-01 MED ORDER — FLEET ENEMA RE ENEM: 1.0000 | ENEMA | Freq: Once | RECTAL | Status: DC | PRN

## 2023-10-01 MED ORDER — THROMBIN 5000 UNITS EX SOLR
OROMUCOSAL | Status: DC | PRN
  Administered 2023-10-01: 5 mL via TOPICAL

## 2023-10-01 MED ORDER — ONDANSETRON HCL 4 MG PO TABS
4.0000 mg | ORAL_TABLET | Freq: Four times a day (QID) | ORAL | Status: DC | PRN
  Administered 2023-10-02: 4 mg via ORAL
  Filled 2023-10-01: qty 1

## 2023-10-01 MED ORDER — CHLORHEXIDINE GLUCONATE 0.12 % MT SOLN
15.0000 mL | Freq: Once | OROMUCOSAL | Status: AC
  Administered 2023-10-01: 15 mL via OROMUCOSAL
  Filled 2023-10-01: qty 15

## 2023-10-01 MED ORDER — PHENYLEPHRINE HCL (PRESSORS) 10 MG/ML IV SOLN: INTRAVENOUS | Status: DC | PRN

## 2023-10-01 MED ORDER — OXYCODONE HCL 5 MG/5ML PO SOLN: 5.0000 mg | Freq: Once | ORAL | Status: DC | PRN

## 2023-10-01 MED ORDER — LIDOCAINE HCL (CARDIAC) PF 100 MG/5ML IV SOSY
PREFILLED_SYRINGE | INTRAVENOUS | Status: DC | PRN
  Administered 2023-10-01: 100 mg via INTRAVENOUS

## 2023-10-01 MED ORDER — TAMSULOSIN HCL 0.4 MG PO CAPS
0.4000 mg | ORAL_CAPSULE | Freq: Every day | ORAL | Status: DC
  Administered 2023-10-01: 0.4 mg via ORAL
  Filled 2023-10-01: qty 1

## 2023-10-01 MED ORDER — BISACODYL 10 MG RE SUPP: 10.0000 mg | Freq: Every day | RECTAL | Status: DC | PRN

## 2023-10-01 MED ORDER — NITROFURANTOIN MONOHYD MACRO 100 MG PO CAPS
100.0000 mg | ORAL_CAPSULE | Freq: Every day | ORAL | Status: DC
  Administered 2023-10-01: 100 mg via ORAL
  Filled 2023-10-01 (×2): qty 1

## 2023-10-01 MED ORDER — CARVEDILOL 6.25 MG PO TABS
6.2500 mg | ORAL_TABLET | Freq: Two times a day (BID) | ORAL | Status: DC
Start: 2023-10-02 — End: 2023-10-02
  Administered 2023-10-02: 6.25 mg via ORAL
  Filled 2023-10-01: qty 1

## 2023-10-01 MED ORDER — PROPOFOL 10 MG/ML IV BOLUS
INTRAVENOUS | Status: AC
  Filled 2023-10-01: qty 20

## 2023-10-01 MED ORDER — POTASSIUM CITRATE ER 15 MEQ (1620 MG) PO TBCR: 1.0000 | EXTENDED_RELEASE_TABLET | Freq: Every day | ORAL | Status: DC

## 2023-10-01 MED ORDER — ONDANSETRON HCL 4 MG/2ML IJ SOLN
INTRAMUSCULAR | Status: AC
  Filled 2023-10-01: qty 2

## 2023-10-01 MED ORDER — DOCUSATE SODIUM 100 MG PO CAPS
100.0000 mg | ORAL_CAPSULE | Freq: Two times a day (BID) | ORAL | Status: DC
  Administered 2023-10-01 – 2023-10-02 (×2): 100 mg via ORAL
  Filled 2023-10-01 (×2): qty 1

## 2023-10-01 MED ORDER — ACETAMINOPHEN 10 MG/ML IV SOLN
INTRAVENOUS | Status: AC
Start: 2023-10-01 — End: 2023-10-01
  Filled 2023-10-01: qty 100

## 2023-10-01 MED ORDER — MAGNESIUM OXIDE -MG SUPPLEMENT 400 (240 MG) MG PO TABS
200.0000 mg | ORAL_TABLET | Freq: Every day | ORAL | Status: DC
  Administered 2023-10-02: 200 mg via ORAL
  Filled 2023-10-01: qty 1

## 2023-10-01 MED ORDER — PRAVASTATIN SODIUM 40 MG PO TABS
40.0000 mg | ORAL_TABLET | Freq: Every evening | ORAL | Status: DC
  Administered 2023-10-01: 40 mg via ORAL
  Filled 2023-10-01: qty 1

## 2023-10-01 MED ORDER — SODIUM CHLORIDE 0.9 % IV SOLN
250.0000 mL | INTRAVENOUS | Status: DC
  Administered 2023-10-01: 250 mL via INTRAVENOUS

## 2023-10-01 MED ORDER — SODIUM CHLORIDE 0.9 % IV SOLN: INTRAVENOUS | Status: DC | PRN

## 2023-10-01 MED ORDER — PANTOPRAZOLE SODIUM 40 MG PO TBEC
80.0000 mg | DELAYED_RELEASE_TABLET | Freq: Every day | ORAL | Status: DC
  Administered 2023-10-02: 80 mg via ORAL
  Filled 2023-10-01: qty 2

## 2023-10-01 MED ORDER — LACTULOSE 10 GM/15ML PO SOLN: 20.0000 g | Freq: Two times a day (BID) | ORAL | Status: DC | PRN

## 2023-10-01 MED ORDER — PHENOL 1.4 % MT LIQD: 1.0000 | OROMUCOSAL | Status: DC | PRN

## 2023-10-01 MED ORDER — ROCURONIUM BROMIDE 10 MG/ML (PF) SYRINGE
PREFILLED_SYRINGE | INTRAVENOUS | Status: DC | PRN
  Administered 2023-10-01: 80 mg via INTRAVENOUS
  Administered 2023-10-01: 20 mg via INTRAVENOUS
  Administered 2023-10-01: 10 mg via INTRAVENOUS

## 2023-10-01 MED ORDER — HYDROMORPHONE HCL 1 MG/ML IJ SOLN
1.0000 mg | INTRAMUSCULAR | Status: DC | PRN
  Administered 2023-10-01 (×2): 1 mg via INTRAVENOUS
  Filled 2023-10-01 (×3): qty 1

## 2023-10-01 MED ORDER — ONDANSETRON HCL 4 MG/2ML IJ SOLN
4.0000 mg | Freq: Four times a day (QID) | INTRAMUSCULAR | Status: DC | PRN
  Administered 2023-10-01: 4 mg via INTRAVENOUS
  Filled 2023-10-01: qty 2

## 2023-10-01 MED ORDER — ORAL CARE MOUTH RINSE: 15.0000 mL | Freq: Once | OROMUCOSAL | Status: AC

## 2023-10-01 MED ORDER — FENTANYL CITRATE (PF) 250 MCG/5ML IJ SOLN
INTRAMUSCULAR | Status: DC | PRN
  Administered 2023-10-01: 100 ug via INTRAVENOUS
  Administered 2023-10-01 (×6): 50 ug via INTRAVENOUS

## 2023-10-01 MED ORDER — LIDOCAINE-EPINEPHRINE 1 %-1:100000 IJ SOLN
INTRAMUSCULAR | Status: AC
  Filled 2023-10-01: qty 1

## 2023-10-01 SURGICAL SUPPLY — 38 items
BAG COUNTER SPONGE SURGICOUNT (BAG) ×1 IMPLANT
BIT DRILL NEURO 2X3.1 SFT TUCH (MISCELLANEOUS) ×1 IMPLANT
CABLE BIPOLOR RESECTION CORD (MISCELLANEOUS) ×1 IMPLANT
CLSR STERI-STRIP ANTIMIC 1/2X4 (GAUZE/BANDAGES/DRESSINGS) IMPLANT
CONTROL REMOTE FREELINK ALPHA (NEUROSURGERY SUPPLIES) IMPLANT
DERMABOND ADVANCED .7 DNX6 (GAUZE/BANDAGES/DRESSINGS) IMPLANT
DRAPE C-ARM 42X72 X-RAY (DRAPES) ×2 IMPLANT
DRAPE LAPAROTOMY 100X72X124 (DRAPES) ×1 IMPLANT
DRSG OPSITE POSTOP 3X4 (GAUZE/BANDAGES/DRESSINGS) IMPLANT
DRSG OPSITE POSTOP 4X6 (GAUZE/BANDAGES/DRESSINGS) ×1 IMPLANT
DRSG OPSITE POSTOP 4X8 (GAUZE/BANDAGES/DRESSINGS) IMPLANT
DURAPREP 26ML APPLICATOR (WOUND CARE) ×1 IMPLANT
ELECTRODE REM PT RTRN 9FT ADLT (ELECTROSURGICAL) ×1 IMPLANT
GLOVE BIOGEL PI IND STRL 8.5 (GLOVE) ×1 IMPLANT
GLOVE ECLIPSE 8.5 STRL (GLOVE) ×1 IMPLANT
GOWN STRL REUS W/ TWL XL LVL3 (GOWN DISPOSABLE) ×1 IMPLANT
GOWN STRL REUS W/TWL 2XL LVL3 (GOWN DISPOSABLE) ×1 IMPLANT
HEMOSTAT POWDER KIT SURGIFOAM (HEMOSTASIS) ×1 IMPLANT
KIT BASIN OR (CUSTOM PROCEDURE TRAY) ×1 IMPLANT
KIT CHARGING PRECISION NEURO (KITS) IMPLANT
KIT IPG ALPHA WAVEWRITER (Stimulator) IMPLANT
KIT TURNOVER KIT B (KITS) ×1 IMPLANT
LEAD COVER EDGE 50CM STIM KIT (Lead) IMPLANT
NDL HYPO 22X1.5 SAFETY MO (MISCELLANEOUS) ×1 IMPLANT
NEEDLE HYPO 22X1.5 SAFETY MO (MISCELLANEOUS) ×1 IMPLANT
NS IRRIG 1000ML POUR BTL (IV SOLUTION) ×1 IMPLANT
PACK LAMINECTOMY NEURO (CUSTOM PROCEDURE TRAY) ×1 IMPLANT
PAD ARMBOARD POSITIONER FOAM (MISCELLANEOUS) ×1 IMPLANT
PASSER ELEVATOR (SPINAL CORD STIMULATOR) IMPLANT
SPIKE FLUID TRANSFER (MISCELLANEOUS) ×1 IMPLANT
SUT SILK 2 0 PERMA HAND 18 BK (SUTURE) ×2 IMPLANT
SUT VIC AB 2-0 CP2 18 (SUTURE) ×1 IMPLANT
SUT VIC AB 3-0 SH 8-18 (SUTURE) ×1 IMPLANT
SUT VIC AB 4-0 RB1 18 (SUTURE) ×1 IMPLANT
TOOL LONG TUNNEL (SPINAL CORD STIMULATOR) IMPLANT
TOWEL GREEN STERILE (TOWEL DISPOSABLE) ×1 IMPLANT
TOWEL GREEN STERILE FF (TOWEL DISPOSABLE) ×1 IMPLANT
WATER STERILE IRR 1000ML POUR (IV SOLUTION) ×1 IMPLANT

## 2023-10-01 NOTE — Plan of Care (Signed)

## 2023-10-01 NOTE — Anesthesia Procedure Notes (Signed)
 Procedure Name: Intubation Date/Time: 10/01/2023 10:49 AM  Performed by: Cindie Donald CROME, CRNAPre-anesthesia Checklist: Patient identified, Emergency Drugs available, Suction available and Patient being monitored Patient Re-evaluated:Patient Re-evaluated prior to induction Oxygen Delivery Method: Circle System Utilized Preoxygenation: Pre-oxygenation with 100% oxygen Induction Type: IV induction Ventilation: Mask ventilation without difficulty and Two handed mask ventilation required Laryngoscope Size: Mac and 4 Grade View: Grade I Tube type: Oral Tube size: 7.5 mm Number of attempts: 1 Airway Equipment and Method: Stylet Placement Confirmation: ETT inserted through vocal cords under direct vision, positive ETCO2 and breath sounds checked- equal and bilateral Secured at: 22 cm Tube secured with: Tape Dental Injury: Teeth and Oropharynx as per pre-operative assessment

## 2023-10-01 NOTE — Anesthesia Postprocedure Evaluation (Signed)
 Anesthesia Post Note  Patient: Brandon Pena  Procedure(s) Performed: INSERTION, SPINAL CORD STIMULATOR, LUMBAR (Back)     Patient location during evaluation: PACU Anesthesia Type: General Level of consciousness: awake and alert Pain management: pain level controlled Vital Signs Assessment: post-procedure vital signs reviewed and stable Respiratory status: spontaneous breathing, nonlabored ventilation and respiratory function stable Cardiovascular status: stable and blood pressure returned to baseline Anesthetic complications: no   No notable events documented.  Last Vitals:  Vitals:   10/01/23 1500 10/01/23 1515  BP: 137/78 137/75  Pulse: 74 77  Resp: 14 13  Temp:    SpO2: 94% 93%    Last Pain:  Vitals:   10/01/23 0851  PainSc: 3     LLE Motor Response: Purposeful movement (10/01/23 1515) LLE Sensation: Full sensation (10/01/23 1515) RLE Motor Response: Purposeful movement (10/01/23 1515) RLE Sensation: Full sensation (10/01/23 1515)      Debby FORBES Like

## 2023-10-01 NOTE — H&P (Signed)
 Brandon Pena is an 68 y.o. male.   Chief Complaint: Chronic intractable back and leg pain.  Nonfunctioning spinal cord stimulator HPI: The patient is a 68 year old individual whose had a spinal cord stimulator for a number of years.  In the last year or 2 its become much less effective and was found to have some leads broken in addition to an expired generator.  He is now admitted to undergo revision of his spinal cord stimulator.  The patient has chronic thrombocytopenia but is stable at 35,000 platelets.  The issue has been worked up by his hematologist  Past Medical History:  Diagnosis Date   Arthritis    osteoarthritis   Bell's palsy    Cirrhosis (HCC)    likely related to MASLD/MASH   Dysrhythmia    GERD (gastroesophageal reflux disease)    Headache    History of atrial fibrillation    reports one time issue in 2012   History of kidney stones    Horseshoe kidney    Hypercholesteremia    Hypertension    Does not see a cardiologist   Sleep apnea    uses CPAP, last sleep study done 8-6yrs. ago   Thrombocytopenia (HCC)    Type II diabetes mellitus (HCC)    Type 2   Wears glasses     Past Surgical History:  Procedure Laterality Date   ADENOIDECTOMY     BICEPS TENDON REPAIR Right 1990's   CIRCUMCISION  1985   COLONOSCOPY W/ POLYPECTOMY     KNEE ARTHROSCOPY Left 1980's   LUMBAR LAMINECTOMY/DECOMPRESSION MICRODISCECTOMY Left 05/15/2013   Procedure: LUMBAR LAMINECTOMY/DECOMPRESSION MICRODISCECTOMY 1 LEVEL;  Surgeon: Arley SHAUNNA Helling, MD;  Location: [facility];  Service: Neurosurgery;  Laterality: Left;  LUMBAR LAMINECTOMY/DECOMPRESSION MICRODISCECTOMY 1 LEVEL L4-5   LUMBAR LAMINECTOMY/DECOMPRESSION MICRODISCECTOMY Left 06/18/2014   Procedure: Microdiscectomy - L4-L5 - left redo;  Surgeon: Arley Helling, MD;  Location: [facility];  Service: Neurosurgery;  Laterality: Left;   ORIF PATELLA FRACTURE Left 1980's   SPINAL CORD STIMULATOR INSERTION     TONSILLECTOMY     TOTAL SHOULDER  ARTHROPLASTY Left 08/16/2016   Procedure: TOTAL SHOULDER ARTHROPLASTY;  Surgeon: Dozier Soulier, MD;  Location: [facility];  Service: Orthopedics;  Laterality: Left;  Left shoulder arthroplasty    Family History  Problem Relation Age of Onset   Diabetes Mother    Lung cancer Father    Parkinson's disease Sister    Social History:  reports that he has never smoked. He has never used smokeless tobacco. He reports that he does not drink alcohol and does not use drugs.  Allergies: No Known Allergies  No medications prior to admission.    No results found for this or any previous visit (from the past 48 hours). No results found.  Review of Systems  Constitutional:  Positive for activity change.  Musculoskeletal:  Positive for back pain and gait problem.    There were no vitals taken for this visit. Physical Exam Constitutional:      Appearance: Normal appearance. He is normal weight.  HENT:     Head: Normocephalic and atraumatic.     Right Ear: Tympanic membrane, ear canal and external ear normal.     Left Ear: Tympanic membrane, ear canal and external ear normal.     Nose: Nose normal.     Mouth/Throat:     Mouth: Mucous membranes are moist.     Pharynx: Oropharynx is clear.  Eyes:     Extraocular  Movements: Extraocular movements intact.     Conjunctiva/sclera: Conjunctivae normal.     Pupils: Pupils are equal, round, and reactive to light.  Cardiovascular:     Rate and Rhythm: Normal rate and regular rhythm.     Pulses: Normal pulses.     Heart sounds: Normal heart sounds.  Pulmonary:     Effort: Pulmonary effort is normal.     Breath sounds: Normal breath sounds.  Abdominal:     General: Abdomen is flat.     Palpations: Abdomen is soft.  Musculoskeletal:        General: Normal range of motion.     Cervical back: Normal range of motion.  Skin:    General: Skin is warm and dry.     Capillary Refill: Capillary refill takes less than 2 seconds.  Neurological:      General: No focal deficit present.     Mental Status: He is alert.  Psychiatric:        Mood and Affect: Mood normal.        Behavior: Behavior normal.        Thought Content: Thought content normal.        Judgment: Judgment normal.      Assessment/Plan Chronic unremitting back pain with with nonfunctioning spinal cord stimulator.  Thrombocytopenia Plan: Revision of spinal cord stimulator  Victory JINNY Gens, MD 10/01/2023, 7:44 AM

## 2023-10-01 NOTE — Progress Notes (Signed)
 Patient ID: Brandon Pena, male   DOB: 03/02/1955, 68 y.o.   MRN: [id number] Vital signs are stable and patient is awake and alert.  Notes that pain is out over the hips but his back is tolerable.  Incisions are clean and dry drain with moderate output.

## 2023-10-01 NOTE — Op Note (Signed)
 Date of surgery: 10/01/2023 Preoperative diagnosis: Intractable pain with nonfunctioning spinal cord stimulator Postoperative diagnosis: Same Procedure: Replacement of spinal cord stimulator with removal of percutaneous leads and placement of paddle electrode and placement of new generator Surgeon: Victory Gens Esthesia: General endotracheal Indications: Brandon Pena is a 68 year old individual whose had a spinal cord stimulator for the last 8 years.  It had been controlling his pain quite well however the generator had expired in addition there was problem with the leads which were nonconducting over half the leads.  For this reason it was decided to replace the spinal cord stimulator placing a paddle electrode over the area most deemed to be hopeful to the T9 vertebrae..  The patient was brought to the operating room supine on the stretcher.  After the smooth induction of general endotracheal anesthesia he was carefully turned prone.  The back was prepped with alcohol DuraPrep and draped in a sterile fashion after marking the areas of the laminotomy site at the T10-T11 for placement of a paddle cephalad to the T9 vertebrae and identifying the previous generator pocket in addition to of the tiedown's for the percutaneous leads.  The skin was infiltrated with a total of 30 cc of a mixture of half percent Marcaine  mixed 50-50 with 1% lidocaine  with epinephrine .  Then the generator area was open with a vertical incision there the generator was self was removed.  The leads were noted to be coiled underneath and these were gradually released.  The tiedown's were then identified and a second incision was created in the upper lumbar spine that tiedown's were identified and released.  Then the leads were removed from the distal aspect and tunneled back through to remove the electrodes from the epidural space.  With the generator and the leads now being removed and incision was created over the thoracic spine at the  chosen laminotomy site.  A laminotomy was approached from the left side and a laminotomy at T10-T11 identified areas of fibrotic attachments to the undersurface of the bone.  This made passing the trial electrode somewhat difficult.  This also required the performance of a laminotomy on the right side so as to allow better control of the electrode trial to be passed cephalad.  Ultimately was decided to place a second laminotomy at the T9-T10 level this allowed guidance of the paddle electrode towards the midline but otherwise would deviate off to the right side and the patient's pain was greater on the left side.  After significant dissection of the epidural space dorsally and laterally to the left side we were able to pass the electrode in a fashion such that set in the midline slightly off to the left side all the way across the vertebral body of T9.  Once this was secured we then able to tunnel the electrodes to the pocket.  The electrodes were connected a singular tiedown was placed at the fascia and the thoracic spine for the #2 electrode.  Electrodes were connected sequentially the impedances were noted to be good and the pocket was then buried in the subcutaneous tissue.  Once positioning of the electrodes and the paddle electrode was verified the wound was closed.  Because the patient had only 35,000 platelets he was transfused 1 rhesus of platelets during the case.  Nonetheless he did lose a fair amount and a small Hemovac drain was placed into the Asik laminectomy site.  This was brought out through separate stab incision the fascia was closed with #1 Vicryl  in interrupted fashion 2-0 Vicryl was used in the subcutaneous tissues and 304 0 Vicryl in the subcuticular skin for all the incisions.  Blood loss for the procedure was estimated over 400 cc.  Patient was returned to recovery room in stable condition.

## 2023-10-01 NOTE — Transfer of Care (Signed)
 Immediate Anesthesia Transfer of Care Note  Patient: Brandon Pena  Procedure(s) Performed: INSERTION, SPINAL CORD STIMULATOR, LUMBAR (Back)  Patient Location: PACU  Anesthesia Type:General  Level of Consciousness: awake, drowsy, and patient cooperative  Airway & Oxygen Therapy: Patient Spontanous Breathing and Patient connected to face mask oxygen  Post-op Assessment: Report given to RN and Post -op Vital signs reviewed and stable  Post vital signs: Reviewed and stable  Last Vitals:  Vitals Value Taken Time  BP 144/83 10/01/23 14:12  Temp    Pulse 91 10/01/23 14:16  Resp 12 10/01/23 14:16  SpO2 98 % 10/01/23 14:16  Vitals shown include unfiled device data.  Last Pain:  Vitals:   10/01/23 0851  PainSc: 3       Patients Stated Pain Goal: 3 (10/01/23 0851)  Complications: No notable events documented.

## 2023-10-02 ENCOUNTER — Encounter (HOSPITAL_COMMUNITY): Payer: Self-pay | Admitting: Neurological Surgery

## 2023-10-02 DIAGNOSIS — T85112A Breakdown (mechanical) of implanted electronic neurostimulator (electrode) of spinal cord, initial encounter: Secondary | ICD-10-CM | POA: Diagnosis not present

## 2023-10-02 LAB — GLUCOSE, CAPILLARY: Glucose-Capillary: 108 mg/dL — ABNORMAL HIGH (ref 70–99)

## 2023-10-02 LAB — PREPARE PLATELET PHERESIS: Unit division: 0

## 2023-10-02 LAB — BPAM PLATELET PHERESIS
Blood Product Expiration Date: 202509182359
ISSUE DATE / TIME: 202509161208
Unit Type and Rh: 5100

## 2023-10-02 MED ORDER — OXYCODONE-ACETAMINOPHEN 10-325 MG PO TABS: 1.0000 | ORAL_TABLET | ORAL | 0 refills | Status: AC | PRN

## 2023-10-02 NOTE — Progress Notes (Signed)
 Patient alert and oriented. Voided, ambulate. Surgical site clean and dry no sign of infection. D/c instructions explain and given to the patient and wife, all questions answered. Patient d/c home with RW per order.

## 2023-10-02 NOTE — Discharge Summary (Signed)
 Physician Discharge Summary  Patient ID: Brandon Pena MRN: [id number] DOB/AGE: 69-Dec-1957 69 y.o.  Admit date: 10/01/2023 Discharge date: 10/02/2023  Admission Diagnoses: Failed spinal cord stimulator.  Chronic pain  Discharge Diagnoses: Failed spinal cord stimulator.  Chronic pain management Principal Problem:   Failed spinal cord stimulator [employer])   Discharged Condition: good  Hospital Course: Patient tolerated surgery well  Consults: None  Significant Diagnostic Studies: None  Treatments: surgery: See op note  Discharge Exam: Blood pressure (!) 123/57, pulse 79, temperature 97.8 F (36.6 C), resp. rate 20, height 5' 11 (1.803 m), weight (!) 140.6 kg, SpO2 100%. Incisions are clean and dry Station and gait is intact.  Disposition: Discharge disposition: 01-Home or Self Care       Discharge Instructions     Call MD for:  redness, tenderness, or signs of infection (pain, swelling, redness, odor or green/yellow discharge around incision site)   Complete by: As directed    Call MD for:  severe uncontrolled pain   Complete by: As directed    Call MD for:  temperature >100.4   Complete by: As directed    Diet - low sodium heart healthy   Complete by: As directed    Discharge wound care:   Complete by: As directed    Okay to shower. Do not apply salves or appointments to incision. No heavy lifting with the upper extremities greater than 10 pounds. May resume driving when not requiring pain medication and patient feels comfortable with doing so.   Incentive spirometry RT   Complete by: As directed    Increase activity slowly   Complete by: As directed       Allergies as of 10/02/2023   No Known Allergies      Medication List     TAKE these medications    carvedilol  6.25 MG tablet Commonly known as: COREG  Take 6.25 mg by mouth 2 (two) times daily with a meal.   ferrous sulfate 325 (65 FE) MG tablet Take 325 mg by mouth in the morning.   finasteride  5  MG tablet Commonly known as: PROSCAR  Take 5 mg by mouth every evening.   folic acid 1 MG tablet Commonly known as: FOLVITE Take 1 mg by mouth in the morning.   lactulose  10 GM/15ML solution Commonly known as: CHRONULAC  Take 20 g by mouth 2 (two) times daily as needed (constipation.).   Magnesium  250 MG Tabs Take 250 mg by mouth every morning.   Mounjaro 2.5 MG/0.5ML Pen Generic drug: tirzepatide Inject 2.5 mg into the skin every Wednesday.   nitrofurantoin  (macrocrystal-monohydrate) 100 MG capsule Commonly known as: MACROBID  Take 100 mg by mouth at bedtime.   omeprazole 40 MG capsule Commonly known as: PRILOSEC Take 40 mg by mouth daily before breakfast.   oxyCODONE -acetaminophen  10-325 MG tablet Commonly known as: PERCOCET Take 1 tablet by mouth every 4 (four) hours as needed (pain.). What changed: when to take this   potassium chloride  10 MEQ CR capsule Commonly known as: MICRO-K  Take 10 mEq by mouth in the morning.   Potassium Citrate  15 MEQ (1620 MG) Tbcr Take 1 tablet by mouth in the morning and at bedtime.   pravastatin  40 MG tablet Commonly known as: PRAVACHOL  Take 40 mg by mouth every evening.   sucralfate  1 g tablet Commonly known as: CARAFATE  Take 2 g by mouth 2 (two) times daily.   tamsulosin  0.4 MG Caps capsule Commonly known as: FLOMAX  Take 0.4 mg by mouth at bedtime.  traZODone  50 MG tablet Commonly known as: DESYREL  Take 50 mg by mouth at bedtime.   TURMERIC PO Take 1 capsule by mouth every morning.   VITAMIN D3 PO Take 1 tablet by mouth daily.   ZINC-A-COLD/VITAMIN C MT Take 1 tablet by mouth daily.               Discharge Care Instructions  (From admission, onward)           Start     Ordered   10/02/23 0000  Discharge wound care:       Comments: Okay to shower. Do not apply salves or appointments to incision. No heavy lifting with the upper extremities greater than 10 pounds. May resume driving when not requiring pain  medication and patient feels comfortable with doing so.   10/02/23 0937             Signed: Victory PARAS Johndavid Pena 10/02/2023, 9:37 AM

## 2023-10-02 NOTE — Evaluation (Signed)
 Occupational Therapy Evaluation Patient Details Name: Brandon Pena MRN: [id number] DOB: 20-Dec-1955 Today's Date: 10/02/2023   History of Present Illness   Pt is a 68 y/o male admitted 10/01/23 for planned insertion/replacement of spinal cord stimulator. Due to intractable leg and back pain. PMH includes Arthritis, Bell's palsy, Cirrhosis, Dysrhythmia, A fibrillation, HTN, Sleep apnea, Thrombocytopenia, Type II diabetes mellitus, Knee arthroscopy (Left, 1980's); ORIF patella fracture (Left, 1980's); Lumbar laminectomy/decompression microdiscectomy (Left, 05/15/2013 and 06/18/2014); Spinal cord stimulator insertion; Tonsillectomy; Adenoidectomy; and Total shoulder arthroplasty (Left, 08/16/2016).     Clinical Impressions Pt is typically mod I for ambulation and ADL with DME (has all necessary at home) and AE. 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 Today Pt reporting pain at 5/10 at incision sites, tingling in legs and feeling like his legs are colder Reviewed back handout and precautions in full with focus on ADL: verbally reviewed bed mobility, practiced LB dressing with figure 4 strategy, safety during bathing (Pt has long handle sponge), compensatory strategies during grooming, and toileting (transfers and peri care). Pt verbalized understanding in all areas. Pt then walked in hallway without DME (occasionally reaching for hallway rail for external balance). OT education complete and OT will sign off at this time as Pt had no further questions at end of session.      If plan is discharge home, recommend the following:   A little help with bathing/dressing/bathroom;Assistance with cooking/housework;Assist for transportation     Functional Status Assessment   Patient has had a recent decline in their functional status and demonstrates the ability to make significant improvements in function in a reasonable and predictable amount of time.     Equipment  Recommendations   None recommended by OT (Pt has appropriate DME)     Recommendations for Other Services   PT consult     Precautions/Restrictions   Precautions Precautions: Back Precaution Booklet Issued: Yes (comment) Recall of Precautions/Restrictions: Intact Required Braces or Orthoses:  (no Brace) Restrictions Weight Bearing Restrictions Per Provider Order: No Other Position/Activity Restrictions: no bending, lifting, twisting     Mobility Bed Mobility               General bed mobility comments: in recliner at beginning and end of session. Reviewed log roll and side lying to sit in handout and verbally with Pt. Pt verbalized understanding    Transfers Overall transfer level: Needs assistance Equipment used: None Transfers: Sit to/from Stand Sit to Stand: Contact guard assist           General transfer comment: no physical assist. Pt able to power up on his own with no overt LOB      Balance Overall balance assessment: Mild deficits observed, not formally tested                                         ADL either performed or assessed with clinical judgement   ADL Overall ADL's : Needs assistance/impaired Eating/Feeding: Independent   Grooming: Supervision/safety;Cueing for sequencing;Cueing for compensatory techniques;Standing Grooming Details (indicate cue type and reason): educated on compensatory strategies verbally, and reviewed in handout again at end of session Upper Body Bathing: Set up;With adaptive equipment;Standing Upper Body Bathing Details (indicate cue type and reason): has long handle sponge that he uses Lower Body Bathing: Contact guard assist;Adhering to back precautions;Sit to/from stand;With adaptive  equipment Lower Body Bathing Details (indicate cue type and reason): has long handle sponge Upper Body Dressing : Minimal assistance Upper Body Dressing Details (indicate cue type and reason): due to body  habitus Lower Body Dressing: Minimal assistance Lower Body Dressing Details (indicate cue type and reason): for LLE, able to bring RLE into figure 4 position Toilet Transfer: Supervision/safety;Ambulation Toilet Transfer Details (indicate cue type and reason): no DME Toileting- Clothing Manipulation and Hygiene: Contact guard assist;Cueing for compensatory techniques;Cueing for back precautions;Sit to/from stand Toileting - Clothing Manipulation Details (indicate cue type and reason): educated in compensatory strategies     Functional mobility during ADLs: Supervision/safety (no DME)       Vision Baseline Vision/History: 1 Wears glasses Ability to See in Adequate Light: 0 Adequate Patient Visual Report: No change from baseline Vision Assessment?: No apparent visual deficits     Perception         Praxis         Pertinent Vitals/Pain Pain Assessment Pain Assessment: 0-10 Pain Score: 5  Pain Location: incision site and L/R access points Pain Descriptors / Indicators: Discomfort, Sore Pain Intervention(s): Monitored during session, Repositioned     Extremity/Trunk Assessment Upper Extremity Assessment Upper Extremity Assessment: Overall WFL for tasks assessed   Lower Extremity Assessment Lower Extremity Assessment: Defer to PT evaluation   Cervical / Trunk Assessment Cervical / Trunk Assessment: Back Surgery;Other exceptions Cervical / Trunk Exceptions: extra body habitus   Communication Communication Communication: No apparent difficulties   Cognition Arousal: Alert Behavior During Therapy: WFL for tasks assessed/performed Cognition: No apparent impairments             OT - Cognition Comments: pleasantly verbose                 Following commands: Intact       Cueing  General Comments   Cueing Techniques: Verbal cues      Exercises     Shoulder Instructions      Home Living Family/patient expects to be discharged to:: Private  residence Living Arrangements: Spouse/significant other Available Help at Discharge: Family;Available PRN/intermittently Type of Home: House Home Access: Stairs to enter [employer] of Steps: 1 Entrance Stairs-Rails: None Home Layout: One level     Bathroom Shower/Tub: Chief Strategy Officer: Standard Bathroom Accessibility: Yes How Accessible: Accessible via walker Home Equipment: Rollator (4 wheels);Cane - single point;Wheelchair - manual   Additional Comments: 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, works as a Paramedic, drives      Prior Functioning/Environment Prior Level of Function : Independent/Modified Independent;Working/employed;Driving             Mobility Comments: has DME but does not use if he can help it ADLs Comments: does his own ADL, assists with IADL    OT Problem List: Decreased activity tolerance;Decreased safety awareness;Decreased knowledge of use of DME or AE;Obesity;Pain   OT Treatment/Interventions:        OT Goals(Current goals can be found in the care plan section)   Acute Rehab OT Goals Patient Stated Goal: be able to get back to work and walk dogs again OT Goal Formulation: With patient Time For Goal Achievement: 10/15/23 Potential to Achieve Goals: Good   OT Frequency:       Co-evaluation              AM-PAC OT 6 Clicks Daily Activity     Outcome Measure Help from another person eating meals?: None Help from another person taking  care of personal grooming?: None Help from another person toileting, which includes using toliet, bedpan, or urinal?: A Little Help from another person bathing (including washing, rinsing, drying)?: A Little Help from another person to put on and taking off regular upper body clothing?: A Little Help from another person to put on and taking off regular lower body clothing?: A Little 6 Click Score: 20   End of Session Nurse Communication: Mobility status;Precautions  Activity  Tolerance: Patient tolerated treatment well Patient left: in chair;with call bell/phone within reach  OT Visit Diagnosis: Other abnormalities of gait and mobility (R26.89);Muscle weakness (generalized) (M62.81);Other symptoms and signs involving the nervous system (R29.898);Pain Pain - Right/Left: Left (central/Bilateral) Pain - part of body: Leg (back)                Time: 9191-9159 OT Time Calculation (min): 32 min Charges:  OT General Charges $OT Visit: 1 Visit OT Evaluation $OT Eval Low Complexity: 1 Low OT Treatments $Self Care/Home Management : 8-22 mins  Leita DEL OTR/L Acute Rehabilitation Services Office: [phone]   Leita PARAS [facility] 10/02/2023, 9:47 AM

## 2023-10-02 NOTE — Evaluation (Signed)
 Physical Therapy Evaluation and Discharge Patient Details Name: ADITH TEJADA MRN: [id number] DOB: [birth year] Today's Date: 10/02/2023  History of Present Illness  Pt is a 68 y/o male admitted 10/01/23 for planned insertion/replacement of spinal cord stimulator. Due to intractable leg and back pain. PMH includes Arthritis, Bell's palsy, Cirrhosis, Dysrhythmia, A fibrillation, HTN, Sleep apnea, Thrombocytopenia, Type II diabetes mellitus, Knee arthroscopy (Left, 1980's); ORIF patella fracture (Left, 1980's); Lumbar laminectomy/decompression microdiscectomy (Left, 05/15/2013 and 06/18/2014); Spinal cord stimulator insertion; Tonsillectomy; Adenoidectomy; and Total shoulder arthroplasty (Left, 08/16/2016).  Clinical Impression   Patient evaluated by Physical Therapy with no further acute PT needs identified, as he is to dc home today; he was in a considerable amount of pain, but still able to mobilize and walk with wide RW. All education has been completed and the patient has no further questions.  See below for any follow-up Physical Therapy or equipment needs. PT is signing off. Thank you for this referral.  The potential need for Outpatient PT can be addressed at Neurosurgery follow-up appointments.          If plan is discharge home, recommend the following: A little help with walking and/or transfers;A little help with bathing/dressing/bathroom;Assistance with cooking/housework;Assist for transportation   Can travel by private vehicle        Equipment Recommendations Rolling walker (2 wheels) (Wide)  Recommendations for Other Services       Functional Status Assessment Patient has had a recent decline in their functional status and demonstrates the ability to make significant improvements in function in a reasonable and predictable amount of time.     Precautions / Restrictions Precautions Precautions: Back Precaution Booklet Issued: Yes (comment) Recall of Precautions/Restrictions:  Intact Required Braces or Orthoses:  (no Brace) Restrictions Weight Bearing Restrictions Per Provider Order: No Other Position/Activity Restrictions: no bending, lifting, twisting      Mobility  Bed Mobility               General bed mobility comments: in recliner at beginning and end of session. Reviewed log roll and side lying to sit in handout and verbally with Pt. Pt verbalized understanding    Transfers Overall transfer level: Needs assistance Equipment used: None Transfers: Sit to/from Stand Sit to Stand: Contact guard assist           General transfer comment: no physical assist. Pt able to power up on his own with no overt LOB    Ambulation/Gait Ambulation/Gait assistance: Supervision Gait Distance (Feet): 200 Feet Assistive device: Rolling walker (2 wheels) (Wide) Gait Pattern/deviations: Step-through pattern       General Gait Details: Painful throughout walk, but managed very good distance with RW; Good use of RW for bil UEs support, and the support also helped with lessening pain and making walking further possible  Stairs            Wheelchair Mobility     Tilt Bed    Modified Rankin (Stroke Patients Only)       Balance Overall balance assessment: Mild deficits observed, not formally tested                                           Pertinent Vitals/Pain Pain Assessment Pain Assessment: 0-10 Pain Score: 9  Pain Location: incision site and L/R access points Pain Descriptors / Indicators: Discomfort, Sore Pain Intervention(s): Premedicated before session, Repositioned, Other (  comment) (rec use of RW to help with stability while walking)    Home Living Family/patient expects to be discharged to:: Private residence Living Arrangements: Spouse/significant other Available Help at Discharge: Family;Available PRN/intermittently Type of Home: House Home Access: Stairs to enter Entrance Stairs-Rails: None Entrance  Stairs-Number of Steps: 1   Home Layout: One level Home Equipment: Rollator (4 wheels);Cane - single point;Wheelchair - manual Additional Comments: 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, works as a Paramedic, drives    Prior Function Prior Level of Function : Independent/Modified Independent;Working/employed;Driving             Mobility Comments: has DME but does not use if he can help it ADLs Comments: does his own ADL, assists with IADL     Extremity/Trunk Assessment   Upper Extremity Assessment Upper Extremity Assessment: Defer to OT evaluation    Lower Extremity Assessment Lower Extremity Assessment: Overall WFL for tasks assessed    Cervical / Trunk Assessment Cervical / Trunk Assessment: Back Surgery;Other exceptions Cervical / Trunk Exceptions: extra body habitus  Communication   Communication Communication: No apparent difficulties    Cognition Arousal: Alert Behavior During Therapy: WFL for tasks assessed/performed                             Following commands: Intact       Cueing Cueing Techniques: Verbal cues     General Comments General comments (skin integrity, edema, etc.): Pt's wife present and halpful;    Exercises     Assessment/Plan    PT Assessment All further PT needs can be met in the next venue of care  PT Problem List Decreased activity tolerance;Decreased mobility;Pain       PT Treatment Interventions      PT Goals (Current goals can be found in the Care Plan section)  Acute Rehab PT Goals Patient Stated Goal: less pain PT Goal Formulation: All assessment and education complete, DC therapy    Frequency       Co-evaluation               AM-PAC PT 6 Clicks Mobility  Outcome Measure Help needed turning from your back to your side while in a flat bed without using bedrails?: None Help needed moving from lying on your back to sitting on the side of a flat bed without using bedrails?: None Help needed moving to and  from a bed to a chair (including a wheelchair)?: None Help needed standing up from a chair using your arms (e.g., wheelchair or bedside chair)?: None Help needed to walk in hospital room?: None Help needed climbing 3-5 steps with a railing? : A Little 6 Click Score: 23    End of Session   Activity Tolerance: Patient tolerated treatment well Patient left: in chair;with call bell/phone within reach;Other (comment) (managing in room) Nurse Communication: Mobility status;Other (comment) (requesting anti-nausea meds) PT Visit Diagnosis: Pain Pain - part of body:  (Back pain)    Time: 8942-8884 PT Time Calculation (min) (ACUTE ONLY): 18 min   Charges:   PT Evaluation $PT Eval Low Complexity: 1 Low   PT General Charges $$ ACUTE PT VISIT: 1 Visit         Silvano Currier, PT  Acute Rehabilitation Services Office [phone] Secure Chat welcomed   Silvano VEAR Currier 10/02/2023, 12:14 PM
